# Patient Record
Sex: Female | Born: 1935 | Race: White | Hispanic: No | Smoking: Never smoker
Health system: Southern US, Community
[De-identification: ages and names within clinical notes are randomized; demographics above are authoritative.]

## PROBLEM LIST (undated history)

## (undated) DIAGNOSIS — I10 Essential (primary) hypertension: Secondary | ICD-10-CM

## (undated) DIAGNOSIS — I509 Heart failure, unspecified: Secondary | ICD-10-CM

## (undated) DIAGNOSIS — I340 Nonrheumatic mitral (valve) insufficiency: Secondary | ICD-10-CM

## (undated) DIAGNOSIS — J189 Pneumonia, unspecified organism: Secondary | ICD-10-CM

## (undated) DIAGNOSIS — E079 Disorder of thyroid, unspecified: Secondary | ICD-10-CM

## (undated) DIAGNOSIS — E785 Hyperlipidemia, unspecified: Secondary | ICD-10-CM

## (undated) DIAGNOSIS — J449 Chronic obstructive pulmonary disease, unspecified: Secondary | ICD-10-CM

## (undated) HISTORY — PX: CHOLECYSTECTOMY: SHX55

## (undated) HISTORY — PX: PACEMAKER INSERTION: SHX728

## (undated) HISTORY — PX: INSERTION OF ICD: SHX6689

## (undated) HISTORY — PX: CORONARY ARTERY BYPASS GRAFT: SHX141

---

## 2006-04-14 ENCOUNTER — Emergency Department (HOSPITAL_COMMUNITY): Admission: EM | Admit: 2006-04-14 | Discharge: 2006-04-15 | Payer: Self-pay | Admitting: Emergency Medicine
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
## 2015-02-05 ENCOUNTER — Non-Acute Institutional Stay: Payer: BC Managed Care – PPO | Admitting: Internal Medicine

## 2015-02-05 DIAGNOSIS — I255 Ischemic cardiomyopathy: Secondary | ICD-10-CM | POA: Diagnosis not present

## 2015-02-05 DIAGNOSIS — F418 Other specified anxiety disorders: Secondary | ICD-10-CM | POA: Diagnosis not present

## 2015-02-05 DIAGNOSIS — E0590 Thyrotoxicosis, unspecified without thyrotoxic crisis or storm: Secondary | ICD-10-CM

## 2015-02-05 DIAGNOSIS — E785 Hyperlipidemia, unspecified: Secondary | ICD-10-CM

## 2015-02-05 DIAGNOSIS — R5381 Other malaise: Secondary | ICD-10-CM | POA: Diagnosis not present

## 2015-02-05 DIAGNOSIS — K5900 Constipation, unspecified: Secondary | ICD-10-CM | POA: Insufficient documentation

## 2015-02-05 DIAGNOSIS — G4700 Insomnia, unspecified: Secondary | ICD-10-CM

## 2015-02-05 DIAGNOSIS — D509 Iron deficiency anemia, unspecified: Secondary | ICD-10-CM | POA: Diagnosis not present

## 2015-02-05 DIAGNOSIS — I498 Other specified cardiac arrhythmias: Secondary | ICD-10-CM | POA: Diagnosis not present

## 2015-02-05 DIAGNOSIS — I25700 Atherosclerosis of coronary artery bypass graft(s), unspecified, with unstable angina pectoris: Secondary | ICD-10-CM | POA: Diagnosis not present

## 2015-02-05 NOTE — Progress Notes (Signed)
Patient ID: Jane [name], female   DOB: [birth year], 81 y.o.   MRN: [id number]     Facility: [facility]   PCP: No primary care provider on file.  Code Status: full code  No Known Allergies  Chief Complaint  Patient presents with  . New Admit To SNF     HPI:  81 y.o. patient is here for short term rehabilitation post hospital admission from 01/01/15-01/30/15 with unstable angina. She was in another hospital in [place] prior to that with chest pain and dyspnea and was treated with Bipap and bumex gtt along with iv nitro. Cardiology was consulted. She underwent cardiac catheterization on 12/29/14 and found to have high grade ostial LAD and ostial circumflex stenosis and CABG was recommended. At this point, family wanter her to be transferred to [facility] for further care. She underwent CABG on 01/04/15. Post operatively, she had blood transfusion, she had arrythmia and required 4 rounds of DCCVs and then amiodarone loading. There was concern of thyroid storm and she was seen by endocrinology and is now on methimazole. She has PMH of CAD with multiple stents, ischemic cardiomyopathy, MR, biventricular ICD, HTN, COPD, GERD, HLD among others. She is seen in her room today with her daughter present. She feels her strength and appetite to be slowly returning. She has been anxious lately with her health. She had been on benzodiazepine in the hospital at a very low dose and had been very lethargic post this and would like to avoid it. She has been working with therapy team. Denies any concerns in particular.   Review of Systems:  Constitutional: Negative for fever, chills, diaphoresis.  HENT: Negative for headache, congestion, nasal discharge, sore throat, difficulty swallowing.   Eyes: Negative for eye pain, blurred vision, double vision and discharge.  Respiratory: has some dyspnea on exertion. Negative for cough, wheezing.   Cardiovascular: Negative for chest pain,  palpitations, leg swelling. denies chest wall soreness. Gastrointestinal: Negative for heartburn, nausea, vomiting, abdominal pain. Had bowel movement this am Genitourinary: Negative for dysuria, flank pain.  Musculoskeletal: Negative for back pain, falls Skin: Negative for itching, rash.  Neurological: Negative for tingling, focal weakness. Positive for dizziness at times. Psychiatric/Behavioral: Negative for depression.   PMH: CAD, ischemic cardiomyopathy, HTN, HLD, COPD, Hyperthyroidism  FAMILY HISTORY: non contributory  SOCIAL HISTORY: denies tobacco use, alcohol use or illicit drug use   Medications:   Medication List       This list is accurate as of: 02/05/15  3:20 PM.  Always use your most recent med list.               acetaminophen 325 MG tablet  Commonly known as:  TYLENOL  Take 650 mg by mouth every 4 (four) hours as needed.     amiodarone 200 MG tablet  Commonly known as:  PACERONE  Take 200 mg by mouth daily.     aspirin EC 81 MG tablet  Take 81 mg by mouth daily.     atorvastatin 20 MG tablet  Commonly known as:  LIPITOR  Take 20 mg by mouth daily.     carvedilol 3.125 MG tablet  Commonly known as:  COREG  Take 3.125 mg by mouth 2 (two) times daily with a meal.     cholecalciferol 1000 units tablet  Commonly known as:  VITAMIN D  Take 1,000 Units by mouth daily.     ferrous sulfate 325 (65 FE) MG tablet  Take 325 mg by  mouth daily with breakfast.     furosemide 20 MG tablet  Commonly known as:  LASIX  Take 20 mg by mouth daily.     lisinopril 2.5 MG tablet  Commonly known as:  PRINIVIL,ZESTRIL  Take 2.5 mg by mouth daily.     magnesium oxide 400 MG tablet  Commonly known as:  MAG-OX  Take 400 mg by mouth 2 (two) times daily.     Melatonin 3 MG Tabs  Take 1 tablet by mouth at bedtime.     methimazole 10 MG tablet  Commonly known as:  TAPAZOLE  Take 10 mg by mouth daily.     polyethylene glycol packet  Commonly known as:  MIRALAX /  GLYCOLAX  Take 17 g by mouth daily as needed.         Physical Exam: Filed Vitals:   02/05/15 1515  BP: 120/70  Pulse: 88  Temp: 98.9 F (37.2 C)  Resp: 18  Weight: 114 lb 9.6 oz (51.982 kg)  SpO2: 94%    General- elderly female, thin built, in no acute distress Head- normocephalic, atraumatic Nose- normal nasal mucosa, no maxillary or frontal sinus tenderness, no nasal discharge Throat- moist mucus membrane Eyes- PERRLA, EOMI, no pallor, no icterus, no discharge, normal conjunctiva, normal sclera Neck- no cervical lymphadenopathy, no jugular vein distension Cardiovascular- normal s1,s2, + murmur, palpable dorsalis pedis and radial pulses, trace leg edema Respiratory- bilateral clear to auscultation, no wheeze, no rhonchi, no crackles, no use of accessory muscles Abdomen- bowel sounds present, soft, non tender, no guarding or rigidity, no CVA tenderness Musculoskeletal- able to move all 4 extremities, generalized weakness with more of proximal weakness in LE.  Neurological- no focal deficit, alert and oriented to person, place and time Skin- warm and dry, sternal incision on the chest wall is healing well with steris trips in place, 3 small incision on chest wall with sutures, graft site on LLE healing well and catheterization site has healed Psychiatry- normal mood and affect   Labs reviewed: 01/29/15 na 137, k 4.1, bun 10, cr 0.5, glu 83, ca 8.7, wbc 5.3, hb 11.2, hct 35.6, plt 289    Assessment/Plan  Physical deconditioning Will have her work with physical therapy and occupational therapy team to help with gait training and muscle strengthening exercises.fall precautions. Skin care. Encourage to be out of bed.   CAD S/p CABG. Continue aspirin ec 81 mg daily, coreg 3.125 mg bid, lipitor 20 mg daily, lisinopril 2.5 mg daily. Has f/u with cardiology on 02/18/15. remains chest pain free. Not on any prn NTG. Add prn NTG   arrhythmia Currently her heart rate is controlled  and has sinus rhythm. Continue amiodarone 200 mg daily and monitor  Hyperthyroidism Continue methimazole 10 mg daily and monitor tsh  Anxiety Situational and experiences it at night time. With her being sensitive to benzodiazepine, try relaxation technique with lavender oil and warm apple juice at bedtime. Monitor clinically. If no improvement, consider anxiolytics  HLD  continue lipitor 20 mg daily  Ischemic cardiomyopathy Continue coreg, lisinopril and lasix 20 mg daily, check bmp  Iron def anemia S/p blood transfusion in hospital. Continue feso4 supplement and check cbc  Constipation Continue miralax daily prn and add senna s 1 tab qhs for now with her complaints of hard stool and straining  Insomnia Continue melatonin 3 mg qhs    Goals of care: short term rehabilitation   Labs/tests ordered: cbc with diff, cmp, tsh next lab  Family/ staff Communication:  reviewed care plan with patient and nursing supervisor    Blanchie Serve, MD  [facility] [phone] (Monday-Friday 8 am - 5 pm) [phone] (afterhours)

## 2015-02-13 ENCOUNTER — Non-Acute Institutional Stay: Payer: BC Managed Care – PPO | Admitting: Nurse Practitioner

## 2015-02-13 DIAGNOSIS — D509 Iron deficiency anemia, unspecified: Secondary | ICD-10-CM | POA: Diagnosis not present

## 2015-02-13 DIAGNOSIS — I25700 Atherosclerosis of coronary artery bypass graft(s), unspecified, with unstable angina pectoris: Secondary | ICD-10-CM | POA: Diagnosis not present

## 2015-02-13 DIAGNOSIS — I498 Other specified cardiac arrhythmias: Secondary | ICD-10-CM | POA: Diagnosis not present

## 2015-02-13 DIAGNOSIS — E785 Hyperlipidemia, unspecified: Secondary | ICD-10-CM

## 2015-02-13 DIAGNOSIS — E0590 Thyrotoxicosis, unspecified without thyrotoxic crisis or storm: Secondary | ICD-10-CM

## 2015-02-13 DIAGNOSIS — K5900 Constipation, unspecified: Secondary | ICD-10-CM

## 2015-02-13 DIAGNOSIS — F418 Other specified anxiety disorders: Secondary | ICD-10-CM | POA: Diagnosis not present

## 2015-02-13 NOTE — Progress Notes (Signed)
Patient ID: Jane [name], female   DOB: [birth year], 82 y.o.   MRN: [id number]     Facility: [facility]   PCP: No primary care provider on file.  Code Status: full code  No Known Allergies  Chief Complaint  Patient presents with  . Discharge Note     HPI:  82 y.o. patient being seen today for discharge home. She has PMH of CAD with multiple stents, ischemic cardiomyopathy, MR, biventricular ICD, HTN, COPD, GERD, HLD . [facility] here for short term rehabilitation After hospitalization for with unstable angina.  She underwent CABG on 01/04/15. Post operatively, she had blood transfusion, she had arrythmia and required 4 rounds of DCCVs and then amiodarone loading. Pt conts on PO amiodarone at this time. Endocrine was following and concern of thyroid storm, currently on methimazole. She had been anxious lately with her health but reports this is improving. Denies chest pains or shortness of breath. Incisions healing well. Patient currently doing well with therapy, now stable to discharge home with home health.   Review of Systems:  Review of Systems  Constitutional: Negative for fever, chills and weight loss.  Respiratory: Negative for cough, sputum production and shortness of breath.   Cardiovascular: Negative for chest pain, palpitations and leg swelling.  Gastrointestinal: Negative for heartburn, abdominal pain, diarrhea and constipation.  Genitourinary: Negative for dysuria, urgency and frequency.  Musculoskeletal: Negative for myalgias and back pain.       Chest tenderness with movement, pain controlled  Skin: Negative.   Neurological: Negative for dizziness and headaches.  Psychiatric/Behavioral: Negative for depression and memory loss. The patient is nervous/anxious (has improved). The patient does not have insomnia.      PMH: CAD, ischemic cardiomyopathy, HTN, HLD, COPD, Hyperthyroidism  FAMILY HISTORY: non contributory  SOCIAL  HISTORY: denies tobacco use, alcohol use or illicit drug use   Medications:   Medication List       This list is accurate as of: 02/13/15 11:42 AM.  Always use your most recent med list.               acetaminophen 325 MG tablet  Commonly known as:  TYLENOL  Take 650 mg by mouth every 4 (four) hours as needed.     amiodarone 200 MG tablet  Commonly known as:  PACERONE  Take 200 mg by mouth daily.     aspirin EC 81 MG tablet  Take 81 mg by mouth daily.     atorvastatin 20 MG tablet  Commonly known as:  LIPITOR  Take 20 mg by mouth daily.     carvedilol 3.125 MG tablet  Commonly known as:  COREG  Take 3.125 mg by mouth 2 (two) times daily with a meal.     cholecalciferol 1000 units tablet  Commonly known as:  VITAMIN D  Take 1,000 Units by mouth daily.     ferrous sulfate 325 (65 FE) MG tablet  Take 325 mg by mouth daily with breakfast.     furosemide 20 MG tablet  Commonly known as:  LASIX  Take 20 mg by mouth daily.     lisinopril 2.5 MG tablet  Commonly known as:  PRINIVIL,ZESTRIL  Take 2.5 mg by mouth daily.     magnesium oxide 400 MG tablet  Commonly known as:  MAG-OX  Take 400 mg by mouth 2 (two) times daily.     Melatonin 3 MG Tabs  Take 1 tablet by mouth at bedtime.  methimazole 10 MG tablet  Commonly known as:  TAPAZOLE  Take 10 mg by mouth daily.     polyethylene glycol packet  Commonly known as:  MIRALAX / GLYCOLAX  Take 17 g by mouth daily as needed.     potassium chloride 10 MEQ tablet  Commonly known as:  K-DUR,KLOR-CON  Take 10 mEq by mouth daily.     sennosides-docusate sodium 8.6-50 MG tablet  Commonly known as:  SENOKOT-S  Take 1 tablet by mouth daily.         Physical Exam: Filed Vitals:   02/13/15 1124  BP: 123/66  Pulse: 90  Temp: 97.8 F (36.6 C)  Resp: 18  Weight: 107 lb (48.535 kg)   Physical Exam  Constitutional: She is oriented to person, place, and time. She appears well-developed and well-nourished.    Eyes: Conjunctivae are normal. Pupils are equal, round, and reactive to light.  Neck: Normal range of motion. Neck supple.  Cardiovascular: Normal rate, regular rhythm and normal heart sounds.   Respiratory: Effort normal and breath sounds normal.  GI: Soft. Bowel sounds are normal. She exhibits no distension.  Musculoskeletal: She exhibits no edema or tenderness.  Neurological: She is alert and oriented to person, place, and time.  Skin: Skin is warm and dry.  Well healing chest incision and graft site to LLE     Labs reviewed: 01/29/15 na 137, k 4.1, bun 10, cr 0.5, glu 83, ca 8.7, wbc 5.3, hb 11.2, hct 35.6, plt 289 02/05/15 na 142, k 4.6, BUN 8, Cr 0.67, glucose 68,  TSH 28 Wbc 5.8, rbc 3.8, hgb 11.4, hct 36.8, plt 256    Assessment/Plan 1. Coronary artery disease involving coronary bypass graft of native heart with unstable angina pectoris (HCC) s/p CABG. Doing well post op at [facility], Continue aspirin 81 mg daily, coreg 3.125 mg BID , lipitor 20 mg daily, lisinopril 2.5 mg daily. remains chest pain free.  Reports she has 2 appts next week to follow up.   2. Constipation, unspecified constipation type Controlled on current regimen, to cont miralax with senna s   3. Hyperthyroidism Started on methimazole 10 mg daily during hospitalization by endocrine, Elevated TSH on recent labs, recent thyroid storm, will need outpatient follow up of TSH by endocrine or PCP  4. Situational anxiety Due to health, reports this has improved at this time.  5. Hyperlipidemia To cont on lipitor 20 mg daily   6. Anemia, iron deficiency Stable, conts on iron daily   7. Other cardiac arrhythmia Stable, conts on amiodarone. Follow up with cardiology scheduled  pt is stable for discharge-will need PT/OT/per home health. No DME needed. Rx written.  will need to follow up with PCP within 1-2 weeks.    Janene Harvey. Biagio Borg  [facility] [phone] 8 am - 5  pm) [phone] (after hours)

## 2015-02-14 DIAGNOSIS — I482 Chronic atrial fibrillation: Secondary | ICD-10-CM | POA: Diagnosis not present

## 2015-02-14 DIAGNOSIS — I509 Heart failure, unspecified: Secondary | ICD-10-CM | POA: Diagnosis not present

## 2015-02-14 DIAGNOSIS — I255 Ischemic cardiomyopathy: Secondary | ICD-10-CM | POA: Diagnosis not present

## 2015-02-14 DIAGNOSIS — I110 Hypertensive heart disease with heart failure: Secondary | ICD-10-CM | POA: Diagnosis not present

## 2015-02-14 DIAGNOSIS — Z48812 Encounter for surgical aftercare following surgery on the circulatory system: Secondary | ICD-10-CM | POA: Diagnosis not present

## 2015-02-14 DIAGNOSIS — I25110 Atherosclerotic heart disease of native coronary artery with unstable angina pectoris: Secondary | ICD-10-CM | POA: Diagnosis not present

## 2015-03-24 ENCOUNTER — Other Ambulatory Visit: Payer: Self-pay | Admitting: Internal Medicine

## 2015-03-27 ENCOUNTER — Other Ambulatory Visit: Payer: Self-pay | Admitting: Internal Medicine

## 2015-04-07 ENCOUNTER — Other Ambulatory Visit: Payer: Self-pay | Admitting: Internal Medicine

## 2015-04-08 ENCOUNTER — Other Ambulatory Visit: Payer: Self-pay | Admitting: Internal Medicine

## 2015-06-10 ENCOUNTER — Other Ambulatory Visit: Payer: Self-pay | Admitting: Internal Medicine

## 2020-12-31 ENCOUNTER — Other Ambulatory Visit: Payer: Self-pay

## 2020-12-31 ENCOUNTER — Encounter (HOSPITAL_BASED_OUTPATIENT_CLINIC_OR_DEPARTMENT_OTHER): Payer: Self-pay | Admitting: *Deleted

## 2020-12-31 ENCOUNTER — Emergency Department (HOSPITAL_BASED_OUTPATIENT_CLINIC_OR_DEPARTMENT_OTHER)
Admission: EM | Admit: 2020-12-31 | Discharge: 2021-01-01 | Disposition: A | Discharge status: Home / Self Care | Payer: Medicare Other | Attending: Emergency Medicine | Admitting: Emergency Medicine

## 2020-12-31 DIAGNOSIS — I509 Heart failure, unspecified: Secondary | ICD-10-CM | POA: Insufficient documentation

## 2020-12-31 DIAGNOSIS — Z951 Presence of aortocoronary bypass graft: Secondary | ICD-10-CM | POA: Diagnosis not present

## 2020-12-31 DIAGNOSIS — Z79899 Other long term (current) drug therapy: Secondary | ICD-10-CM | POA: Diagnosis not present

## 2020-12-31 DIAGNOSIS — I110 Hypertensive heart disease with heart failure: Secondary | ICD-10-CM | POA: Insufficient documentation

## 2020-12-31 DIAGNOSIS — R040 Epistaxis: Secondary | ICD-10-CM | POA: Diagnosis not present

## 2020-12-31 DIAGNOSIS — Z7982 Long term (current) use of aspirin: Secondary | ICD-10-CM | POA: Insufficient documentation

## 2020-12-31 HISTORY — DX: Disorder of thyroid, unspecified: E07.9

## 2020-12-31 HISTORY — DX: Pneumonia, unspecified organism: J18.9

## 2020-12-31 HISTORY — DX: Hyperlipidemia, unspecified: E78.5

## 2020-12-31 HISTORY — DX: Heart failure, unspecified: I50.9

## 2020-12-31 HISTORY — DX: Nonrheumatic mitral (valve) insufficiency: I34.0

## 2020-12-31 HISTORY — DX: Essential (primary) hypertension: I10

## 2020-12-31 NOTE — ED Triage Notes (Signed)
C/o nosebleed left nare x 6  hrs denies injury, bleeding controlled

## 2021-01-01 DIAGNOSIS — R040 Epistaxis: Secondary | ICD-10-CM | POA: Diagnosis not present

## 2021-01-01 MED ORDER — SILVER NITRATE-POT NITRATE 75-25 % EX MISC
CUTANEOUS | Status: AC
  Administered 2021-01-01: 1 via TOPICAL
  Filled 2021-01-01: qty 10

## 2021-01-01 MED ORDER — OXYMETAZOLINE HCL 0.05 % NA SOLN
3.0000 | Freq: Two times a day (BID) | NASAL | Status: DC | PRN
  Administered 2021-01-01: 3 via NASAL
  Filled 2021-01-01: qty 30

## 2021-01-01 MED ORDER — SILVER NITRATE-POT NITRATE 75-25 % EX MISC: 4.0000 | Freq: Once | CUTANEOUS | Status: AC

## 2021-01-01 NOTE — ED Provider Notes (Signed)
[facility] Provider Note: [name], MD, FACEP  CSN: [id number] MRN: [id number] ARRIVAL: 12/31/20 at 2245 ROOM: MH07/MH07   CHIEF COMPLAINT  Epistaxis   HISTORY OF PRESENT ILLNESS  01/01/21 12:26 AM Jane [name] is a 87 y.o. female with left-sided epistaxis beginning about 6 hours prior to arrival.  The bleeding has been intermittent and mild to moderate but never heavy.  It is bleeding from the anterior naris.  She denies trauma.  She is not on anticoagulation.  She has been able to get it to slow with pressure but never completely abate.   Past Medical History:  Diagnosis Date   CHF (congestive heart failure) (HCC)    Hyperlipidemia    Hypertension    Mitral regurgitation    Pneumonia    Thyroid disease     Past Surgical History:  Procedure Laterality Date   CHOLECYSTECTOMY     CORONARY ARTERY BYPASS GRAFT     PACEMAKER INSERTION      No family history on file.  Social History   Tobacco Use   Smoking status: Never   Smokeless tobacco: Never  Substance Use Topics   Alcohol use: Not Currently   Drug use: Not Currently    Prior to Admission medications   Medication Sig Start Date End Date Taking? Authorizing Provider  acetaminophen (TYLENOL) 325 MG tablet Take 650 mg by mouth every 4 (four) hours as needed.    [provider]  amiodarone (PACERONE) 200 MG tablet Take 200 mg by mouth daily.    [provider]  aspirin EC 81 MG tablet Take 81 mg by mouth daily.    [provider]  atorvastatin (LIPITOR) 20 MG tablet Take 20 mg by mouth daily.    [provider]  carvedilol (COREG) 3.125 MG tablet Take 3.125 mg by mouth 2 (two) times daily with a meal.    [provider]  cholecalciferol (VITAMIN D) 1000 units tablet Take 1,000 Units by mouth daily.    [provider]  ferrous sulfate 325 (65 FE) MG tablet Take 325 mg by mouth daily with breakfast.    [provider]  furosemide  (LASIX) 20 MG tablet Take 20 mg by mouth daily.    [provider]  lisinopril (PRINIVIL,ZESTRIL) 2.5 MG tablet Take 2.5 mg by mouth daily.    [provider]  magnesium oxide (MAG-OX) 400 MG tablet Take 400 mg by mouth 2 (two) times daily.    [provider]  Melatonin 3 MG TABS Take 1 tablet by mouth at bedtime.    [provider]  methimazole (TAPAZOLE) 10 MG tablet Take 10 mg by mouth daily.    [provider]  polyethylene glycol (MIRALAX / GLYCOLAX) packet Take 17 g by mouth daily as needed.    [provider]  potassium chloride (K-DUR,KLOR-CON) 10 MEQ tablet Take 10 mEq by mouth daily.    [provider]  sennosides-docusate sodium (SENOKOT-S) 8.6-50 MG tablet Take 1 tablet by mouth daily.    [provider]    Allergies Patient has no known allergies.   REVIEW OF SYSTEMS  Negative except as noted here or in the History of Present Illness.   PHYSICAL EXAMINATION  Initial Vital Signs Blood pressure 123/63, pulse 74, temperature (!) 97.5 F (36.4 C), temperature source Tympanic, resp. rate 18, height 5\' 1"  (1.549 m), weight 44 kg, SpO2 97 %.  Examination General: Well-developed, well-nourished female in no acute distress; appearance consistent with  age of record HENT: normocephalic; atraumatic; blood oozing from left anterior septum Eyes: Normal appearing Neck: supple Heart: regular rate and rhythm; harsh systolic murmur Lungs: clear to auscultation bilaterally Abdomen: soft; nondistended; nontender; bowel sounds present Extremities: No deformity; full range of motion; pulses normal Neurologic: Awake, alert and oriented; motor function intact in all extremities and symmetric; no facial droop Skin: Warm and dry Psychiatric: Normal mood and affect   RESULTS  Summary of this visit's results, reviewed and interpreted by myself:   EKG Interpretation  Date/Time:    Ventricular Rate:    PR Interval:     QRS Duration:   QT Interval:    QTC Calculation:   R Axis:     Text Interpretation:         Laboratory Studies: No results found for this or any previous visit (from the past 24 hour(s)). Imaging Studies: No results found.  ED COURSE and MDM  Nursing notes, initial and subsequent vitals signs, including pulse oximetry, reviewed and interpreted by myself.  Vitals:   12/31/20 2254 12/31/20 2259 12/31/20 2303 01/01/21 0021  BP:  123/63  114/77  Pulse:  74  77  Resp:  18  18  Temp:   (!) 97.5 F (36.4 C)   TempSrc:  Oral Tympanic   SpO2:  97%  99%  Weight: 44 kg     Height: 5\' 1"  (1.549 m)      Medications  oxymetazoline (AFRIN) 0.05 % nasal spray 3 spray (has no administration in time range)  silver nitrate applicators applicator 4 Stick (1 Stick Topical Given 01/01/21 0034)    1:05 AM Left naris still hemostatic.  Patient will be sent home with Afrin as needed for recurrence.  PROCEDURES  .Epistaxis Management  Date/Time: 01/01/2021 12:42 AM Performed by: Nanea Jared, MD Authorized by: Trevontae Lindahl, MD   Consent:    Consent obtained:  Verbal   Consent given by:  Patient   Risks, benefits, and alternatives were discussed: yes     Risks discussed:  Pain Universal protocol:    Procedure explained and questions answered to patient or proxy's satisfaction: yes     Patient identity confirmed:  Verbally with patient Anesthesia:    Anesthesia method:  None Procedure details:    Treatment site:  L anterior   Treatment method:  Silver nitrate   Treatment complexity:  Limited   Treatment episode: initial   Post-procedure details:    Assessment:  Bleeding stopped   Procedure completion:  Tolerated well, no immediate complications   ED DIAGNOSES     ICD-10-CM   1. Acute anterior epistaxis  R04.0          Galya Dunnigan, 01/03/2021, MD 01/01/21 01/03/21

## 2022-04-26 ENCOUNTER — Inpatient Hospital Stay (HOSPITAL_BASED_OUTPATIENT_CLINIC_OR_DEPARTMENT_OTHER)
Admission: EM | Admit: 2022-04-26 | Discharge: 2022-05-02 | DRG: 291 | Disposition: A | Discharge status: Hospice | Payer: Medicare Other | Attending: Internal Medicine | Admitting: Internal Medicine

## 2022-04-26 ENCOUNTER — Emergency Department (HOSPITAL_BASED_OUTPATIENT_CLINIC_OR_DEPARTMENT_OTHER): Payer: Medicare Other

## 2022-04-26 DIAGNOSIS — K314 Gastric diverticulum: Secondary | ICD-10-CM | POA: Diagnosis present

## 2022-04-26 DIAGNOSIS — K259 Gastric ulcer, unspecified as acute or chronic, without hemorrhage or perforation: Secondary | ICD-10-CM | POA: Diagnosis present

## 2022-04-26 DIAGNOSIS — I5082 Biventricular heart failure: Secondary | ICD-10-CM | POA: Diagnosis present

## 2022-04-26 DIAGNOSIS — E43 Unspecified severe protein-calorie malnutrition: Secondary | ICD-10-CM | POA: Diagnosis present

## 2022-04-26 DIAGNOSIS — N281 Cyst of kidney, acquired: Secondary | ICD-10-CM | POA: Diagnosis present

## 2022-04-26 DIAGNOSIS — J449 Chronic obstructive pulmonary disease, unspecified: Secondary | ICD-10-CM | POA: Diagnosis present

## 2022-04-26 DIAGNOSIS — K561 Intussusception: Secondary | ICD-10-CM

## 2022-04-26 DIAGNOSIS — J9610 Chronic respiratory failure, unspecified whether with hypoxia or hypercapnia: Secondary | ICD-10-CM | POA: Diagnosis present

## 2022-04-26 DIAGNOSIS — Z9981 Dependence on supplemental oxygen: Secondary | ICD-10-CM

## 2022-04-26 DIAGNOSIS — Z79899 Other long term (current) drug therapy: Secondary | ICD-10-CM

## 2022-04-26 DIAGNOSIS — Z951 Presence of aortocoronary bypass graft: Secondary | ICD-10-CM

## 2022-04-26 DIAGNOSIS — I083 Combined rheumatic disorders of mitral, aortic and tricuspid valves: Secondary | ICD-10-CM | POA: Diagnosis present

## 2022-04-26 DIAGNOSIS — I5043 Acute on chronic combined systolic (congestive) and diastolic (congestive) heart failure: Secondary | ICD-10-CM | POA: Diagnosis present

## 2022-04-26 DIAGNOSIS — I959 Hypotension, unspecified: Secondary | ICD-10-CM | POA: Diagnosis present

## 2022-04-26 DIAGNOSIS — R8271 Bacteriuria: Secondary | ICD-10-CM | POA: Diagnosis present

## 2022-04-26 DIAGNOSIS — E0590 Thyrotoxicosis, unspecified without thyrotoxic crisis or storm: Secondary | ICD-10-CM | POA: Diagnosis present

## 2022-04-26 DIAGNOSIS — Z7982 Long term (current) use of aspirin: Secondary | ICD-10-CM

## 2022-04-26 DIAGNOSIS — E876 Hypokalemia: Secondary | ICD-10-CM | POA: Diagnosis present

## 2022-04-26 DIAGNOSIS — Z7189 Other specified counseling: Secondary | ICD-10-CM

## 2022-04-26 DIAGNOSIS — E785 Hyperlipidemia, unspecified: Secondary | ICD-10-CM | POA: Diagnosis present

## 2022-04-26 DIAGNOSIS — K449 Diaphragmatic hernia without obstruction or gangrene: Secondary | ICD-10-CM | POA: Diagnosis present

## 2022-04-26 DIAGNOSIS — Z955 Presence of coronary angioplasty implant and graft: Secondary | ICD-10-CM

## 2022-04-26 DIAGNOSIS — Z95810 Presence of automatic (implantable) cardiac defibrillator: Secondary | ICD-10-CM

## 2022-04-26 DIAGNOSIS — I130 Hypertensive heart and chronic kidney disease with heart failure and stage 1 through stage 4 chronic kidney disease, or unspecified chronic kidney disease: Principal | ICD-10-CM | POA: Diagnosis present

## 2022-04-26 DIAGNOSIS — R0602 Shortness of breath: Secondary | ICD-10-CM

## 2022-04-26 DIAGNOSIS — Z1152 Encounter for screening for COVID-19: Secondary | ICD-10-CM

## 2022-04-26 DIAGNOSIS — Z515 Encounter for palliative care: Secondary | ICD-10-CM

## 2022-04-26 DIAGNOSIS — Z66 Do not resuscitate: Secondary | ICD-10-CM | POA: Diagnosis not present

## 2022-04-26 DIAGNOSIS — I482 Chronic atrial fibrillation, unspecified: Secondary | ICD-10-CM | POA: Diagnosis present

## 2022-04-26 DIAGNOSIS — K219 Gastro-esophageal reflux disease without esophagitis: Secondary | ICD-10-CM | POA: Diagnosis present

## 2022-04-26 DIAGNOSIS — Z681 Body mass index (BMI) 19 or less, adult: Secondary | ICD-10-CM

## 2022-04-26 DIAGNOSIS — Z7901 Long term (current) use of anticoagulants: Secondary | ICD-10-CM

## 2022-04-26 DIAGNOSIS — I5023 Acute on chronic systolic (congestive) heart failure: Secondary | ICD-10-CM | POA: Diagnosis present

## 2022-04-26 DIAGNOSIS — N1832 Chronic kidney disease, stage 3b: Secondary | ICD-10-CM | POA: Diagnosis present

## 2022-04-26 DIAGNOSIS — E039 Hypothyroidism, unspecified: Secondary | ICD-10-CM | POA: Diagnosis present

## 2022-04-26 DIAGNOSIS — Z7984 Long term (current) use of oral hypoglycemic drugs: Secondary | ICD-10-CM

## 2022-04-26 DIAGNOSIS — I252 Old myocardial infarction: Secondary | ICD-10-CM

## 2022-04-26 DIAGNOSIS — I255 Ischemic cardiomyopathy: Secondary | ICD-10-CM | POA: Diagnosis present

## 2022-04-26 DIAGNOSIS — I447 Left bundle-branch block, unspecified: Secondary | ICD-10-CM | POA: Diagnosis present

## 2022-04-26 DIAGNOSIS — R7989 Other specified abnormal findings of blood chemistry: Secondary | ICD-10-CM

## 2022-04-26 DIAGNOSIS — I480 Paroxysmal atrial fibrillation: Secondary | ICD-10-CM | POA: Diagnosis present

## 2022-04-26 DIAGNOSIS — R109 Unspecified abdominal pain: Secondary | ICD-10-CM

## 2022-04-26 DIAGNOSIS — D509 Iron deficiency anemia, unspecified: Secondary | ICD-10-CM | POA: Diagnosis present

## 2022-04-26 DIAGNOSIS — I2510 Atherosclerotic heart disease of native coronary artery without angina pectoris: Secondary | ICD-10-CM | POA: Diagnosis present

## 2022-04-26 HISTORY — DX: Chronic obstructive pulmonary disease, unspecified: J44.9

## 2022-04-26 LAB — COMPREHENSIVE METABOLIC PANEL
ALT: 15 U/L (ref 0–44)
AST: 16 U/L (ref 15–41)
Albumin: 3.4 g/dL — ABNORMAL LOW (ref 3.5–5.0)
Alkaline Phosphatase: 65 U/L (ref 38–126)
Anion gap: 10 (ref 5–15)
BUN: 36 mg/dL — ABNORMAL HIGH (ref 8–23)
CO2: 23 mmol/L (ref 22–32)
Calcium: 9.1 mg/dL (ref 8.9–10.3)
Chloride: 108 mmol/L (ref 98–111)
Creatinine, Ser: 1.47 mg/dL — ABNORMAL HIGH (ref 0.44–1.00)
GFR, Estimated: 34 mL/min — ABNORMAL LOW (ref >60)
Glucose, Bld: 118 mg/dL — ABNORMAL HIGH (ref 70–99)
Potassium: 3.2 mmol/L — ABNORMAL LOW (ref 3.5–5.1)
Sodium: 141 mmol/L (ref 135–145)
Total Bilirubin: 1.1 mg/dL (ref 0.3–1.2)
Total Protein: 7.1 g/dL (ref 6.5–8.1)

## 2022-04-26 LAB — RESP PANEL BY RT-PCR (RSV, FLU A&B, COVID)  RVPGX2
Influenza A by PCR: NEGATIVE
Influenza B by PCR: NEGATIVE
Resp Syncytial Virus by PCR: NEGATIVE
SARS Coronavirus 2 by RT PCR: NEGATIVE

## 2022-04-26 LAB — LIPASE, BLOOD: Lipase: 36 U/L (ref 11–51)

## 2022-04-26 LAB — CBC
HCT: 44.4 % (ref 36.0–46.0)
Hemoglobin: 13.7 g/dL (ref 12.0–15.0)
MCH: 32.3 pg (ref 26.0–34.0)
MCHC: 30.9 g/dL (ref 30.0–36.0)
MCV: 104.7 fL — ABNORMAL HIGH (ref 80.0–100.0)
Platelets: 185 10*3/uL (ref 150–400)
RBC: 4.24 MIL/uL (ref 3.87–5.11)
RDW: 16.1 % — ABNORMAL HIGH (ref 11.5–15.5)
WBC: 10 10*3/uL (ref 4.0–10.5)
nRBC: 0.4 % — ABNORMAL HIGH (ref 0.0–0.2)

## 2022-04-26 LAB — LACTIC ACID, PLASMA: Lactic Acid, Venous: 1 mmol/L (ref 0.5–1.9)

## 2022-04-26 LAB — BRAIN NATRIURETIC PEPTIDE: B Natriuretic Peptide: 1839.9 pg/mL — ABNORMAL HIGH (ref 0.0–100.0)

## 2022-04-26 LAB — TROPONIN I (HIGH SENSITIVITY): Troponin I (High Sensitivity): 38 ng/L — ABNORMAL HIGH (ref <18)

## 2022-04-26 MED ORDER — IOHEXOL 300 MG/ML  SOLN
60.0000 mL | Freq: Once | INTRAMUSCULAR | Status: AC | PRN
  Administered 2022-04-26: 60 mL via INTRAVENOUS

## 2022-04-26 NOTE — ED Provider Notes (Signed)
[facility] Provider Note   CSN: [id number] Arrival date & time: 04/26/22  2035     History {Add pertinent medical, surgical, social history, OB history to HPI:1} Chief Complaint  Patient presents with   Abdominal Pain    Jane [name] is a 89 y.o. female.   Abdominal Pain      Home Medications Prior to Admission medications   Medication Sig Start Date End Date Taking? Authorizing Provider  acetaminophen (TYLENOL) 325 MG tablet Take 650 mg by mouth every 4 (four) hours as needed.    [provider]  amiodarone (PACERONE) 200 MG tablet Take 200 mg by mouth daily.    [provider]  aspirin EC 81 MG tablet Take 81 mg by mouth daily.    [provider]  atorvastatin (LIPITOR) 20 MG tablet Take 20 mg by mouth daily.    [provider]  carvedilol (COREG) 3.125 MG tablet Take 3.125 mg by mouth 2 (two) times daily with a meal.    [provider]  cholecalciferol (VITAMIN D) 1000 units tablet Take 1,000 Units by mouth daily.    [provider]  ferrous sulfate 325 (65 FE) MG tablet Take 325 mg by mouth daily with breakfast.    [provider]  furosemide (LASIX) 20 MG tablet Take 20 mg by mouth daily.    [provider]  lisinopril (PRINIVIL,ZESTRIL) 2.5 MG tablet Take 2.5 mg by mouth daily.    [provider]  magnesium oxide (MAG-OX) 400 MG tablet Take 400 mg by mouth 2 (two) times daily.    [provider]  Melatonin 3 MG TABS Take 1 tablet by mouth at bedtime.    [provider]  methimazole (TAPAZOLE) 10 MG tablet Take 10 mg by mouth daily.    [provider]  polyethylene glycol (MIRALAX / GLYCOLAX) packet Take 17 g by mouth daily as needed.    [provider]  potassium chloride (K-DUR,KLOR-CON) 10 MEQ tablet Take 10 mEq by mouth daily.    [provider]  sennosides-docusate sodium (SENOKOT-S) 8.6-50 MG  tablet Take 1 tablet by mouth daily.    [provider]      Allergies    Patient has no known allergies.    Review of Systems   Review of Systems  Gastrointestinal:  Positive for abdominal pain.    Physical Exam Updated Vital Signs BP 136/73   Pulse 76   Temp 97.6 F (36.4 C) (Oral)   Resp (!) 22   SpO2 100%  Physical Exam  ED Results / Procedures / Treatments   Labs (all labs ordered are listed, but only abnormal results are displayed) Labs Reviewed  COMPREHENSIVE METABOLIC PANEL - Abnormal; Notable for the following components:      Result Value   Potassium 3.2 (*)    Glucose, Bld 118 (*)    BUN 36 (*)    Creatinine, Ser 1.47 (*)    Albumin 3.4 (*)    GFR, Estimated 34 (*)    All other components within normal limits  CBC - Abnormal; Notable for the following components:   MCV 104.7 (*)    RDW 16.1 (*)    nRBC 0.4 (*)    All other components within normal limits  LIPASE, BLOOD  URINALYSIS, ROUTINE W REFLEX MICROSCOPIC    EKG None  Radiology No results found.  Procedures Procedures  {Document cardiac monitor, telemetry assessment procedure when appropriate:1}  Medications  Ordered in ED Medications - No data to display  ED Course/ Medical Decision Making/ A&P   {   Click here for ABCD2, HEART and other calculatorsREFRESH Note before signing :1}                          Medical Decision Making Amount and/or Complexity of Data Reviewed Labs: ordered. Radiology: ordered.  Risk Prescription drug management.    Jane [name] is a 89 y.o. female with a complex past medical history including hypertension, hyperlipidemia, CHF, thyroid disease, previous cholecystectomy, CAD with previous CABG, and pacemaker who presents with decreased oral intake, belching, worsening peripheral edema, shortness of breath and difficulty breathing, and severe abdominal pain.  According to patient, for the last 2 days she has been having on and off severe abdominal  pain is up to 10 out of 10 in severity.  She is not having the pain right now.  She has had some nausea and vomiting but is not vomiting now.  She reports no constipation or diarrhea and denies any trauma.  She reports she has had difficulty breathing for the last week or 2 and has been using home ox intermittently.  She has had more edema in her legs than normal.  She denies any new leg pain.  Denies any significant cough or congestion.  Denies any sick contacts.  Denies any dysuria or hematuria or urinary symptoms although she just was treated for UTI several weeks ago.  On my exam, abdomen was nontender and you could hear a loud murmur all over her torso.  Back and flanks nontender.  Intact pulses in extremities.  Some edema seen in the feet.  Patient well-appearing and answering questions appropriately.  She is very thin.  Dry mucous membranes.  Due to the patient's severe abdominal pain we will get CT imaging.  Will get chest x-ray and labs.  With her edema and short of breath we will get a BNP and troponin.    Patient CT just returned showing evidence of duodenal intussusception.  With the patient is 10 out of 10 pain that comes and goes this could fit especially with the belching and nausea and vomiting.  It also shows the gastric antrum with ulceration.  No perforation described.  BNP is elevated at 1800 and troponin is elevated at 38, will trend.  Lactic acid not elevated and viral testing negative.  She does not have a leukocytosis or significant anemia.  Lipase normal.  Creatinine elevated 1.4 but otherwise LFTs normal.  Still waiting on urinalysis.  With this intussusception, will consult general surgery however I anticipate this would not be something that need surgery tonight as she is now symptom-free.  With her likely fluid overload and shortness of breath I do feel she needs admission.  Patient may need to talk to GI as well during admission to  get endoscopy as recommended by radiology on  their read.  They are also saying she has atelectasis or infiltrate although with her lack of cough have less suspicion for pneumonia at this time.  Anticipate admission after discussion with general surgery to make sure she does not need emergent surgery.   12:01 AM Spoke to Dr. Brantley Stage with general surgery who suspect this looks more like malignancy than intussusception at this time.  He does feel that the patient would benefit from admission and likely seen by GI tomorrow.  He agrees that his team will see them and says  she could go to either hospital.  He agrees with admission for the diuresis for the shortness of breath and edema.  I spoke with family and they agree with plan of care.  Will call for medicine admission for intermittent abdominal pain with imaging showing evidence of possible intussusception or ulceration as well as fluid overload with elevated BNP and troponin.  Patient will be admitted.   {Document critical care time when appropriate:1} {Document review of labs and clinical decision tools ie heart score, Chads2Vasc2 etc:1}  {Document your independent review of radiology images, and any outside records:1} {Document your discussion with family members, caretakers, and with consultants:1} {Document social determinants of health affecting pt's care:1} {Document your decision making why or why not admission, treatments were needed:1} Final Clinical Impression(s) / ED Diagnoses Final diagnoses:  None    Rx / DC Orders ED Discharge Orders     None

## 2022-04-26 NOTE — ED Notes (Signed)
Patient transported to CT 

## 2022-04-26 NOTE — ED Triage Notes (Signed)
Pt here with daughters for RLQ pain that radiates into R leg, increased belching, and bilateral lower leg swelling. Pt has hx of CHF, pacemaker, 7 stents, COPD. Pt has oxygen at home that she's supposed to wear at night but pt refuses. Per daughters, pt told them she took 5 nitroglycerin's yesterday to help with her abdominal pain.

## 2022-04-27 ENCOUNTER — Encounter (HOSPITAL_BASED_OUTPATIENT_CLINIC_OR_DEPARTMENT_OTHER): Payer: Self-pay | Admitting: Family Medicine

## 2022-04-27 ENCOUNTER — Other Ambulatory Visit: Payer: Self-pay

## 2022-04-27 DIAGNOSIS — R7989 Other specified abnormal findings of blood chemistry: Secondary | ICD-10-CM | POA: Diagnosis not present

## 2022-04-27 DIAGNOSIS — I5082 Biventricular heart failure: Secondary | ICD-10-CM | POA: Diagnosis present

## 2022-04-27 DIAGNOSIS — E0590 Thyrotoxicosis, unspecified without thyrotoxic crisis or storm: Secondary | ICD-10-CM | POA: Diagnosis present

## 2022-04-27 DIAGNOSIS — R142 Eructation: Secondary | ICD-10-CM

## 2022-04-27 DIAGNOSIS — R110 Nausea: Secondary | ICD-10-CM

## 2022-04-27 DIAGNOSIS — Z7189 Other specified counseling: Secondary | ICD-10-CM | POA: Insufficient documentation

## 2022-04-27 DIAGNOSIS — E039 Hypothyroidism, unspecified: Secondary | ICD-10-CM | POA: Diagnosis present

## 2022-04-27 DIAGNOSIS — I340 Nonrheumatic mitral (valve) insufficiency: Secondary | ICD-10-CM | POA: Diagnosis not present

## 2022-04-27 DIAGNOSIS — R0602 Shortness of breath: Secondary | ICD-10-CM | POA: Diagnosis not present

## 2022-04-27 DIAGNOSIS — K259 Gastric ulcer, unspecified as acute or chronic, without hemorrhage or perforation: Secondary | ICD-10-CM | POA: Diagnosis present

## 2022-04-27 DIAGNOSIS — N1832 Chronic kidney disease, stage 3b: Secondary | ICD-10-CM | POA: Diagnosis present

## 2022-04-27 DIAGNOSIS — D509 Iron deficiency anemia, unspecified: Secondary | ICD-10-CM | POA: Diagnosis present

## 2022-04-27 DIAGNOSIS — I083 Combined rheumatic disorders of mitral, aortic and tricuspid valves: Secondary | ICD-10-CM | POA: Diagnosis present

## 2022-04-27 DIAGNOSIS — Z7901 Long term (current) use of anticoagulants: Secondary | ICD-10-CM | POA: Diagnosis not present

## 2022-04-27 DIAGNOSIS — I5042 Chronic combined systolic (congestive) and diastolic (congestive) heart failure: Secondary | ICD-10-CM | POA: Diagnosis not present

## 2022-04-27 DIAGNOSIS — I5023 Acute on chronic systolic (congestive) heart failure: Secondary | ICD-10-CM | POA: Diagnosis present

## 2022-04-27 DIAGNOSIS — I130 Hypertensive heart and chronic kidney disease with heart failure and stage 1 through stage 4 chronic kidney disease, or unspecified chronic kidney disease: Secondary | ICD-10-CM | POA: Diagnosis present

## 2022-04-27 DIAGNOSIS — E43 Unspecified severe protein-calorie malnutrition: Secondary | ICD-10-CM | POA: Diagnosis not present

## 2022-04-27 DIAGNOSIS — Z66 Do not resuscitate: Secondary | ICD-10-CM | POA: Diagnosis not present

## 2022-04-27 DIAGNOSIS — I480 Paroxysmal atrial fibrillation: Secondary | ICD-10-CM | POA: Diagnosis present

## 2022-04-27 DIAGNOSIS — Z951 Presence of aortocoronary bypass graft: Secondary | ICD-10-CM | POA: Diagnosis not present

## 2022-04-27 DIAGNOSIS — Z515 Encounter for palliative care: Secondary | ICD-10-CM | POA: Diagnosis not present

## 2022-04-27 DIAGNOSIS — R935 Abnormal findings on diagnostic imaging of other abdominal regions, including retroperitoneum: Secondary | ICD-10-CM | POA: Diagnosis not present

## 2022-04-27 DIAGNOSIS — R1013 Epigastric pain: Secondary | ICD-10-CM

## 2022-04-27 DIAGNOSIS — I959 Hypotension, unspecified: Secondary | ICD-10-CM | POA: Diagnosis present

## 2022-04-27 DIAGNOSIS — I5031 Acute diastolic (congestive) heart failure: Secondary | ICD-10-CM | POA: Diagnosis not present

## 2022-04-27 DIAGNOSIS — I5043 Acute on chronic combined systolic (congestive) and diastolic (congestive) heart failure: Secondary | ICD-10-CM | POA: Diagnosis present

## 2022-04-27 DIAGNOSIS — R109 Unspecified abdominal pain: Secondary | ICD-10-CM

## 2022-04-27 DIAGNOSIS — R627 Adult failure to thrive: Secondary | ICD-10-CM | POA: Diagnosis not present

## 2022-04-27 DIAGNOSIS — J449 Chronic obstructive pulmonary disease, unspecified: Secondary | ICD-10-CM | POA: Diagnosis present

## 2022-04-27 DIAGNOSIS — K561 Intussusception: Secondary | ICD-10-CM | POA: Diagnosis not present

## 2022-04-27 DIAGNOSIS — Z681 Body mass index (BMI) 19 or less, adult: Secondary | ICD-10-CM | POA: Diagnosis not present

## 2022-04-27 DIAGNOSIS — K449 Diaphragmatic hernia without obstruction or gangrene: Secondary | ICD-10-CM

## 2022-04-27 DIAGNOSIS — Z9981 Dependence on supplemental oxygen: Secondary | ICD-10-CM | POA: Diagnosis not present

## 2022-04-27 DIAGNOSIS — Z1152 Encounter for screening for COVID-19: Secondary | ICD-10-CM | POA: Diagnosis not present

## 2022-04-27 DIAGNOSIS — K319 Disease of stomach and duodenum, unspecified: Secondary | ICD-10-CM

## 2022-04-27 DIAGNOSIS — E785 Hyperlipidemia, unspecified: Secondary | ICD-10-CM | POA: Diagnosis present

## 2022-04-27 DIAGNOSIS — J9610 Chronic respiratory failure, unspecified whether with hypoxia or hypercapnia: Secondary | ICD-10-CM | POA: Diagnosis present

## 2022-04-27 DIAGNOSIS — I482 Chronic atrial fibrillation, unspecified: Secondary | ICD-10-CM | POA: Diagnosis not present

## 2022-04-27 DIAGNOSIS — E876 Hypokalemia: Secondary | ICD-10-CM | POA: Diagnosis present

## 2022-04-27 LAB — URINALYSIS, ROUTINE W REFLEX MICROSCOPIC
Bilirubin Urine: NEGATIVE
Glucose, UA: NEGATIVE mg/dL
Hgb urine dipstick: NEGATIVE
Ketones, ur: NEGATIVE mg/dL
Nitrite: NEGATIVE
Protein, ur: NEGATIVE mg/dL
Specific Gravity, Urine: 1.01 (ref 1.005–1.030)
pH: 5 (ref 5.0–8.0)

## 2022-04-27 LAB — URINALYSIS, MICROSCOPIC (REFLEX)

## 2022-04-27 LAB — TROPONIN I (HIGH SENSITIVITY): Troponin I (High Sensitivity): 32 ng/L — ABNORMAL HIGH (ref <18)

## 2022-04-27 LAB — LACTIC ACID, PLASMA: Lactic Acid, Venous: 1 mmol/L (ref 0.5–1.9)

## 2022-04-27 MED ORDER — LISINOPRIL 5 MG PO TABS
5.0000 mg | ORAL_TABLET | Freq: Every day | ORAL | Status: DC
  Administered 2022-04-27: 5 mg via ORAL
  Filled 2022-04-27: qty 1

## 2022-04-27 MED ORDER — WITCH HAZEL-GLYCERIN EX PADS
MEDICATED_PAD | CUTANEOUS | Status: DC | PRN
  Filled 2022-04-27: qty 100

## 2022-04-27 MED ORDER — FERROUS SULFATE 325 (65 FE) MG PO TABS
325.0000 mg | ORAL_TABLET | Freq: Every day | ORAL | Status: DC
  Filled 2022-04-27: qty 1

## 2022-04-27 MED ORDER — PANTOPRAZOLE SODIUM 40 MG IV SOLR: 40.0000 mg | Freq: Every day | INTRAVENOUS | Status: DC

## 2022-04-27 MED ORDER — BISACODYL 5 MG PO TBEC: 5.0000 mg | DELAYED_RELEASE_TABLET | Freq: Every day | ORAL | Status: DC | PRN

## 2022-04-27 MED ORDER — ACETAMINOPHEN 650 MG RE SUPP: 650.0000 mg | Freq: Four times a day (QID) | RECTAL | Status: DC | PRN

## 2022-04-27 MED ORDER — ONDANSETRON HCL 4 MG/2ML IJ SOLN: 4.0000 mg | Freq: Four times a day (QID) | INTRAMUSCULAR | Status: DC | PRN

## 2022-04-27 MED ORDER — FUROSEMIDE 20 MG PO TABS
20.0000 mg | ORAL_TABLET | Freq: Every day | ORAL | Status: DC
  Administered 2022-04-27 – 2022-04-28 (×2): 20 mg via ORAL
  Filled 2022-04-27 (×2): qty 1

## 2022-04-27 MED ORDER — APIXABAN 2.5 MG PO TABS
2.5000 mg | ORAL_TABLET | Freq: Two times a day (BID) | ORAL | Status: DC
  Administered 2022-04-27: 2.5 mg via ORAL
  Filled 2022-04-27: qty 1

## 2022-04-27 MED ORDER — ATORVASTATIN CALCIUM 10 MG PO TABS
20.0000 mg | ORAL_TABLET | Freq: Every day | ORAL | Status: DC
  Administered 2022-04-27: 20 mg via ORAL
  Filled 2022-04-27: qty 2

## 2022-04-27 MED ORDER — HYDRALAZINE HCL 20 MG/ML IJ SOLN: 5.0000 mg | INTRAMUSCULAR | Status: DC | PRN

## 2022-04-27 MED ORDER — PANTOPRAZOLE SODIUM 40 MG IV SOLR: 40.0000 mg | INTRAVENOUS | Status: DC

## 2022-04-27 MED ORDER — DOCUSATE SODIUM 100 MG PO CAPS
100.0000 mg | ORAL_CAPSULE | Freq: Two times a day (BID) | ORAL | Status: DC
  Administered 2022-04-27 – 2022-05-02 (×11): 100 mg via ORAL
  Filled 2022-04-27 (×11): qty 1

## 2022-04-27 MED ORDER — ONDANSETRON HCL 4 MG PO TABS: 4.0000 mg | ORAL_TABLET | Freq: Four times a day (QID) | ORAL | Status: DC | PRN

## 2022-04-27 MED ORDER — SODIUM CHLORIDE 0.9% FLUSH
3.0000 mL | Freq: Two times a day (BID) | INTRAVENOUS | Status: DC
  Administered 2022-04-27 – 2022-05-01 (×9): 3 mL via INTRAVENOUS

## 2022-04-27 MED ORDER — METHIMAZOLE 5 MG PO TABS
2.5000 mg | ORAL_TABLET | Freq: Every day | ORAL | Status: DC
  Administered 2022-04-27 – 2022-05-02 (×6): 2.5 mg via ORAL
  Filled 2022-04-27 (×9): qty 1

## 2022-04-27 MED ORDER — FUROSEMIDE 10 MG/ML IJ SOLN
20.0000 mg | Freq: Once | INTRAMUSCULAR | Status: AC
  Administered 2022-04-27: 20 mg via INTRAVENOUS
  Filled 2022-04-27: qty 2

## 2022-04-27 MED ORDER — LACTATED RINGERS IV SOLN: INTRAVENOUS | Status: DC

## 2022-04-27 MED ORDER — PANTOPRAZOLE SODIUM 40 MG PO TBEC
40.0000 mg | DELAYED_RELEASE_TABLET | Freq: Two times a day (BID) | ORAL | Status: DC
  Administered 2022-04-27: 40 mg via ORAL
  Filled 2022-04-27: qty 1

## 2022-04-27 MED ORDER — MORPHINE SULFATE (PF) 2 MG/ML IV SOLN: 2.0000 mg | INTRAVENOUS | Status: DC | PRN

## 2022-04-27 MED ORDER — SPIRONOLACTONE 12.5 MG HALF TABLET
12.5000 mg | ORAL_TABLET | Freq: Every day | ORAL | Status: DC
  Administered 2022-04-27 – 2022-05-02 (×6): 12.5 mg via ORAL
  Filled 2022-04-27 (×6): qty 1

## 2022-04-27 MED ORDER — ATORVASTATIN CALCIUM 10 MG PO TABS
20.0000 mg | ORAL_TABLET | Freq: Every day | ORAL | Status: DC
  Administered 2022-04-28 – 2022-05-01 (×4): 20 mg via ORAL
  Filled 2022-04-27 (×4): qty 2

## 2022-04-27 MED ORDER — METOPROLOL TARTRATE 25 MG PO TABS
25.0000 mg | ORAL_TABLET | Freq: Two times a day (BID) | ORAL | Status: DC
  Administered 2022-04-27 – 2022-05-02 (×11): 25 mg via ORAL
  Filled 2022-04-27 (×11): qty 1

## 2022-04-27 MED ORDER — SENNOSIDES-DOCUSATE SODIUM 8.6-50 MG PO TABS
1.0000 | ORAL_TABLET | Freq: Every day | ORAL | Status: DC
  Administered 2022-04-27 – 2022-05-02 (×6): 1 via ORAL
  Filled 2022-04-27 (×6): qty 1

## 2022-04-27 MED ORDER — POLYETHYLENE GLYCOL 3350 17 G PO PACK: 17.0000 g | PACK | Freq: Every day | ORAL | Status: DC | PRN

## 2022-04-27 MED ORDER — PANTOPRAZOLE SODIUM 40 MG IV SOLR
40.0000 mg | Freq: Every day | INTRAVENOUS | Status: DC
  Administered 2022-04-27 – 2022-04-28 (×2): 40 mg via INTRAVENOUS
  Filled 2022-04-27 (×2): qty 10

## 2022-04-27 MED ORDER — POTASSIUM CHLORIDE CRYS ER 20 MEQ PO TBCR
40.0000 meq | EXTENDED_RELEASE_TABLET | Freq: Once | ORAL | Status: AC
  Administered 2022-04-27: 40 meq via ORAL
  Filled 2022-04-27: qty 2

## 2022-04-27 MED ORDER — EMPAGLIFLOZIN 10 MG PO TABS
10.0000 mg | ORAL_TABLET | Freq: Every day | ORAL | Status: DC
  Administered 2022-04-27: 10 mg via ORAL
  Filled 2022-04-27 (×2): qty 1

## 2022-04-27 MED ORDER — ACETAMINOPHEN 325 MG PO TABS
650.0000 mg | ORAL_TABLET | Freq: Four times a day (QID) | ORAL | Status: DC | PRN
  Administered 2022-04-29 – 2022-05-01 (×3): 650 mg via ORAL
  Filled 2022-04-27 (×3): qty 2

## 2022-04-27 NOTE — Consult Note (Addendum)
Consultation  Referring Provider: [facility]/ [facility] Primary Care Physician:  System, Provider Not In Primary Gastroenterologist:  Dr.Wild/ [facility]  Reason for Consultation: Acute abdominal pain, abnormal CT imaging  HPI: Jane [name] is a 89 y.o. female, who was admitted through the emergency room last evening, brought in by her family with complaints of abdominal pain which has been present over the past couple of days.  She was also having a lot of repeated belching and burping.  She denies any nausea, did have 1 episode of vomiting a couple of days ago.  In addition she also had been noted to have increasing lower extremity edema and increased shortness of breath yesterday.  Her daughter says she took 5 nitroglycerin yesterday afternoon because she was having a hard time breathing.  She is usually on 2 L of O2 at home though does not use it 24/7.  She does have history of severe congestive heart failure, is status post pacemaker and ICD, has history of severe mitral regurgitation, chronic kidney disease stage III, coronary artery disease status post CABG 2016.  She has a total of 7 stents in.  Also is status post cholecystectomy.  Chest x-ray on admission showed cardiomegaly with mild to moderate severity of interstitial edema. Labs-WBC 10.0/hemoglobin 13.7/hematocrit 40/MCV 104 Potassium 3.2/36/creatinine 1.48 LFTs within normal limits BNP 1839 Troponins flat Lactate 1.0 And a short run of nonsustained V. tach this morning.  Patient is not having any ongoing abdominal pain today, she is very frustrated because she has been n.p.o. and says her mouth is so dry that she cannot breathe.  She continues to have burping and belching denies nausea. Family at bedside.  CT of the abdomen and pelvis on admission showed a moderate right effusion, small left effusion, she status post cholecystectomy normal-appearing liver.  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  There is dense gastric mucosa and a short segment of intussusception in the proximal duodenum just beyond the duodenal bulb, no dilated bowel.  Family relates that patient had been seen by GI at [facility]/Dr. Heide Guile in March 2023 with complaints of abdominal discomfort bloating and belching been present for several months.  Complained of a sense of fullness per notes.  Kersting in those notes it also states that when she feels very short of breath this occurs since she feels like if she cannot belch then she is "going to die".  She had been admitted at [facility] in November 2022 and had CT during that time to did not show any evidence of obstruction neoplasm or inflammation.  Was noted to have a hiatal hernia.  Also underwent upper GI that showed a small sliding hiatal hernia, benign stricture in the distal esophagus just proximal to the GE junction no evidence of gastric outlet obstruction otherwise negative.     Per the GI notes due to her advanced cardiac disease and arrhythmias and inoperable valvular disease she was not felt to be a candidate for endoscopic evaluation and was recommended for her to stay on a daily PPI and continue antacids as needed, stop carbonates.   Past Medical History:  Diagnosis Date   CHF (congestive heart failure) (HCC)    COPD (chronic obstructive pulmonary disease) (HCC)    Hyperlipidemia    Hypertension  Mitral regurgitation    Pneumonia    Thyroid disease     Past Surgical History:  Procedure Laterality Date   CHOLECYSTECTOMY     CORONARY ARTERY BYPASS GRAFT     INSERTION OF ICD      Prior to Admission medications   Medication Sig Start Date End Date Taking? Authorizing Provider  apixaban (ELIQUIS) 2.5 MG TABS tablet Take 2.5 mg by mouth 2 (two) times daily.   Yes [provider]   atorvastatin (LIPITOR) 20 MG tablet Take 20 mg by mouth every evening.   Yes [provider]  calcium carbonate (OSCAL) 1500 (600 Ca) MG TABS tablet Take 600 mg of elemental calcium by mouth at bedtime.   Yes [provider]  CYANOCOBALAMIN PO Take 1 tablet by mouth daily.   Yes [provider]  docusate sodium (COLACE) 50 MG capsule Take 50 mg by mouth every 3 (three) days.   Yes [provider]  empagliflozin (JARDIANCE) 10 MG TABS tablet Take 10 mg by mouth daily.   Yes [provider]  ferrous sulfate 325 (65 FE) MG tablet Take 325 mg by mouth daily with breakfast.   Yes [provider]  furosemide (LASIX) 20 MG tablet Take 20-40 mg by mouth daily. Pt alternates 20mg  one day and 40mg  the next day   Yes [provider]  lisinopril (PRINIVIL,ZESTRIL) 2.5 MG tablet Take 5 mg by mouth daily.   Yes [provider]  methimazole (TAPAZOLE) 5 MG tablet Take 2.5 mg by mouth daily. 06/12/20  Yes [provider]  metoprolol tartrate (LOPRESSOR) 25 MG tablet Take 25 mg by mouth 2 (two) times daily.   Yes [provider]  Multiple Vitamins-Minerals (PRESERVISION AREDS 2 PO) Take 1 tablet by mouth 2 (two) times daily.   Yes [provider]  nitroGLYCERIN (NITROSTAT) 0.4 MG SL tablet Place 0.4 mg under the tongue every 5 (five) minutes as needed for chest pain.   Yes [provider]  oxymetazoline (AFRIN) 0.05 % nasal spray Place 1 spray into both nostrils 2 (two) times daily as needed for congestion.   Yes [provider]  pantoprazole (PROTONIX) 40 MG tablet Take 40 mg by mouth 2 (two) times daily before a meal.   Yes [provider]  spironolactone (ALDACTONE) 25 MG tablet Take 12.5 mg by mouth daily.   Yes [provider]    Current Facility-Administered Medications  Medication Dose Route Frequency Provider Last Rate Last Admin   acetaminophen (TYLENOL) tablet 650 mg  650  mg Oral Q6H PRN Karmen Bongo, MD       Or   acetaminophen (TYLENOL) suppository 650 mg  650 mg Rectal Q6H PRN Karmen Bongo, MD       [START ON 04/28/2022] atorvastatin (LIPITOR) tablet 20 mg  20 mg Oral QHS Karmen Bongo, MD       bisacodyl (DULCOLAX) EC tablet 5 mg  5 mg Oral Daily PRN Karmen Bongo, MD       docusate sodium (COLACE) capsule 100 mg  100 mg Oral BID Karmen Bongo, MD   100 mg at 04/27/22 1159   furosemide (LASIX) tablet 20 mg  20 mg Oral Daily Karmen Bongo, MD   20 mg at 04/27/22 1047   hydrALAZINE (APRESOLINE) injection 5 mg  5 mg Intravenous Q4H PRN Karmen Bongo, MD       lactated ringers infusion   Intravenous Continuous Karmen Bongo, MD 75 mL/hr at 04/27/22 1143 New Bag at 04/27/22  1143   lisinopril (ZESTRIL) tablet 5 mg  5 mg Oral Daily Karmen Bongo, MD   5 mg at 04/27/22 1049   methimazole (TAPAZOLE) tablet 2.5 mg  2.5 mg Oral Daily Karmen Bongo, MD   2.5 mg at 04/27/22 1159   metoprolol tartrate (LOPRESSOR) tablet 25 mg  25 mg Oral BID Karmen Bongo, MD   25 mg at 04/27/22 1047   morphine (PF) 2 MG/ML injection 2 mg  2 mg Intravenous Q2H PRN Karmen Bongo, MD       ondansetron [facility]) tablet 4 mg  4 mg Oral Q6H PRN Karmen Bongo, MD       Or   ondansetron [facility]) injection 4 mg  4 mg Intravenous Q6H PRN Karmen Bongo, MD       pantoprazole (PROTONIX) injection 40 mg  40 mg Intravenous Daily Karmen Bongo, MD       polyethylene glycol (MIRALAX / GLYCOLAX) packet 17 g  17 g Oral Daily PRN Karmen Bongo, MD       senna-docusate (Senokot-S) tablet 1 tablet  1 tablet Oral Daily Karmen Bongo, MD   1 tablet at 04/27/22 1048   sodium chloride flush (NS) 0.9 % injection 3 mL  3 mL Intravenous Q12H Karmen Bongo, MD   3 mL at 04/27/22 1157   spironolactone (ALDACTONE) tablet 12.5 mg  12.5 mg Oral Daily Karmen Bongo, MD   12.5 mg at 04/27/22 1049    Allergies as of 04/26/2022   (No Known Allergies)    No family history on  file.  Social History   Socioeconomic History   Marital status: Widowed    Spouse name: Not on file   Number of children: Not on file   Years of education: Not on file   Highest education level: Not on file  Occupational History   Not on file  Tobacco Use   Smoking status: Never   Smokeless tobacco: Never  Substance and Sexual Activity   Alcohol use: Not Currently   Drug use: Not Currently   Sexual activity: Not on file  Other Topics Concern   Not on file  Social History Narrative   Not on file   Social Determinants of Health   Financial Resource Strain: Not on file  Food Insecurity: Not on file  Transportation Needs: Not on file  Physical Activity: Not on file  Stress: Not on file  Social Connections: Not on file  Intimate Partner Violence: Not on file    Review of Systems: Pertinent positive and negative review of systems were noted in the above HPI section.  All other review of systems was otherwise negative.   Physical Exam: Vital signs in last 24 hours: Temp:  [97.6 F (36.4 C)-98.6 F (37 C)] 97.7 F (36.5 C) (03/18 0922) Pulse Rate:  [73-88] 75 (03/18 0922) Resp:  [17-29] 24 (03/18 0730) BP: (100-136)/(52-83) 109/62 (03/18 0922) SpO2:  [96 %-100 %] 96 % (03/18 0922)   General:   Alert,  Well-developed, very frail, small very elderly white female pleasant and cooperative in NAD, uncomfortable appearing, belching, family at bedside Head:  Normocephalic and atraumatic. Eyes:  Sclera clear, no icterus.   Conjunctiva pink. Ears:  Normal auditory acuity. Nose:  No deformity, discharge,  or lesions. Mouth:  No deformity or lesions.   Neck:  Supple; no masses or thyromegaly. Lungs: Decreased breath sounds bilaterally greater than left, few faint Rales  heart:  Regular rate and rhythm; loud systolic murmur Abdomen:  Soft, no focal tenderness  nondistended, BS active,nonpalp mass or hsm.   Rectal:  not Done Msk:  Symmetrical without gross deformities.  . Pulses/ext: 1+edema bilateral lower extremities  Neurologic:  Alert and  oriented x4;  grossly normal neurologically. Skin:  Intact without significant lesions or rashes.. Psych:  Alert and cooperative. Normal mood and affect.  Intake/Output from previous day: 03/17 0701 - 03/18 0700 In: -  Out: 350 [Urine:350] Intake/Output this shift: Total I/O In: -  Out: 150 [Urine:150]  Lab Results: Recent Labs    04/26/22 2122  WBC 10.0  HGB 13.7  HCT 44.4  PLT 185   BMET Recent Labs    04/26/22 2122  NA 141  K 3.2*  CL 108  CO2 23  GLUCOSE 118*  BUN 36*  CREATININE 1.47*  CALCIUM 9.1   LFT Recent Labs    04/26/22 2122  PROT 7.1  ALBUMIN 3.4*  AST 16  ALT 15  ALKPHOS 65  BILITOT 1.1   PT/INR No results for input(s): "LABPROT", "INR" in the last 72 hours. Hepatitis Panel No results for input(s): "HEPBSAG", "HCVAB", "HEPAIGM", "HEPBIGM" in the last 72 hours.    IMPRESSION:  #32  89 year old white female with history of biventricular congestive heart failure, coronary artery disease status post CABG 2016, history of atrial fibrillation, she is status post pacemaker and ICD placement, has history of severe mitral regurgitation, aortic stenosis, chronic respiratory failure on O2 2 L chronically.  To the ER yesterday evening with progressive complaints of shortness of breath, difficulty getting a breath no clear chest pain though she had taken 5 nitroglycerin at home yesterday afternoon.,  Also was having belching and burping which had been present over the previous couple of days along with intermittent complaints of lower abdominal pain and 1 episode of vomiting which occurred a couple of days ago.  Her daughter states that she has had these episodes of belching and burping in the past sometimes in coordination with acute congestive heart failure, other times not.  Query if some of the acute GI symptoms could be secondary to relative mesenteric insufficiency in  setting of congestive heart failure  She had undergone evaluation at [facility] in March 2023 for recurrent belching and burping, she underwent upper GI as outlined above, and also had had a prior CT there November 2022 with no concerning findings but was noted to have a small hiatal hernia and mild distal esophageal stricture.  Decision at that time was to manage her conservatively and not try to pursue EGD due to her multiple comorbidities.  She was advised to continue a PPI and avoid carbonates.  Workup this admission is consistent with acute on chronic congestive heart failure And I suspect majority of her symptoms are secondary to this.  # 2 abnormal CT with finding of what appears to be a gastric diverticulum and then CT also mentioned a very short segment of intussusception of the duodenum just beyond the bulb .  This may be a transient finding, could not rule out small associated lesion as a lead point. No evidence of obstruction.  #3 bilateral pleural effusions right greater than left #4.  Large left renal cyst #5  Anticoagulation-on Eliquis-dose yesterday  Plan; Patient is a poor endoscopic candidate due to multiple comorbidities as outlined above and acute congestive heart failure would leave her on daily PPI Start clear liquid diet and advance slowly as tolerates Repeat abdominal films in a.m. No plans for endoscopic evaluation at this time. Will follow-up in a.m.  Amy EsterwoodPA-C 04/27/2022, 12:50 PM   Attending physician's note  I have taken a history, reviewed the chart and examined the patient. I performed a substantive portion of this encounter, including complete performance of at least one of the key components, in conjunction with the APP. I agree with the APP's note, impression and recommendations.    89 year old very pleasant female with severe congestive heart failure, A-fib s/p ICD, aortic stenosis, severe mitral regurgitation, chronic respiratory failure on  home O2  Complains of intermittent nausea, abdominal bloating, dyspepsia, early satiety and excessive belching Her symptoms are likely secondary to severe CHF/right heart failure, they are somewhat improved with diuresis since admission  Noted incidental finding of possible short segment intussusception of the duodenum She is currently asymptomatic She is at significant risk for anesthesia and procedure related complications with EGD, can consider upper GI series instead which will likely be both diagnostic and therapeutic.  On anticoagulation with Eliquis  The patient was provided an opportunity to ask questions and all were answered. The patient agreed with the plan and demonstrated an understanding of the instructions.  Damaris Hippo , MD [phone]

## 2022-04-27 NOTE — ED Notes (Signed)
[facility] here to get pt 

## 2022-04-27 NOTE — ED Notes (Signed)
ED TO INPATIENT HANDOFF REPORT  ED Nurse Name and Phone #: Kaiyah Eber RN  S Name/Age/Gender Jane [name] 89 y.o. female Room/Bed: MH10/MH10  Code Status   Code Status: Not on file  Home/SNF/Other Home Patient oriented to: self, place, time, and situation Is this baseline? Yes   Triage Complete: Triage complete  Chief Complaint Acute on chronic systolic (congestive) heart failure ([place]) [I50.23]  Triage Note Pt here with daughters for RLQ pain that radiates into R leg, increased belching, and bilateral lower leg swelling. Pt has hx of CHF, pacemaker, 7 stents, COPD. Pt has oxygen at home that she's supposed to wear at night but pt refuses. Per daughters, pt told them she took 5 nitroglycerin's yesterday to help with her abdominal pain.    Allergies No Known Allergies  Level of Care/Admitting Diagnosis ED Disposition     ED Disposition  Admit   Condition  --   Comment  Hospital Area: [place] [[id number]]  Level of Care: Progressive [102]  Admit to Progressive based on following criteria: Other see comments  Comments: Acute chf  May [facility] patient to [facility] or [facility] if equivalent level of care is available:: No  Interfacility transfer: Yes  Covid Evaluation: Asymptomatic - no recent exposure (last 10 days) testing not required  Diagnosis: Acute on chronic systolic (congestive) heart failure [facility]) [id number]  Admitting Physician: Christel Mormon [id number]  Attending Physician: Christel Mormon [id number]  Certification:: I certify this patient will need inpatient services for at least 2 midnights  Estimated Length of Stay: 2          B Medical/Surgery History Past Medical History:  Diagnosis Date   CHF (congestive heart failure) ([place])    Hyperlipidemia    Hypertension    Mitral regurgitation    Pneumonia    Thyroid disease    Past Surgical History:  Procedure Laterality Date   CHOLECYSTECTOMY     CORONARY ARTERY BYPASS GRAFT      PACEMAKER INSERTION       A IV Location/Drains/Wounds Patient Lines/Drains/Airways Status     Active Line/Drains/Airways     Name Placement date Placement time Site Days   Peripheral IV 04/26/22 20 G 1" Anterior;Proximal;Right Forearm 04/26/22  2236  Forearm  1   External Urinary Catheter 04/27/22  0144  --  less than 1            Intake/Output Last 24 hours  Intake/Output Summary (Last 24 hours) at 04/27/2022 [id number] Last data filed at 04/27/2022 [id number] Gross per 24 hour  Intake --  Output 350 ml  Net -350 ml    Labs/Imaging Results for orders placed or performed during the hospital encounter of 04/26/22 (from the past 48 hour(s))  Lipase, blood     Status: None   Collection Time: 04/26/22  9:22 PM  Result Value Ref Range   Lipase 36 11 - 51 U/L    Comment: Performed at [facility], [address] [place], [place] [address]  Comprehensive metabolic panel     Status: Abnormal   Collection Time: 04/26/22  9:22 PM  Result Value Ref Range   Sodium 141 135 - 145 mmol/L   Potassium 3.2 (L) 3.5 - 5.1 mmol/L   Chloride 108 98 - 111 mmol/L   CO2 23 22 - 32 mmol/L   Glucose, Bld 118 (H) 70 - 99 mg/dL    Comment: Glucose reference range applies only to samples taken after fasting for  at least 8 hours.   BUN 36 (H) 8 - 23 mg/dL   Creatinine, Ser 1.47 (H) 0.44 - 1.00 mg/dL   Calcium 9.1 8.9 - 10.3 mg/dL   Total Protein 7.1 6.5 - 8.1 g/dL   Albumin 3.4 (L) 3.5 - 5.0 g/dL   AST 16 15 - 41 U/L   ALT 15 0 - 44 U/L   Alkaline Phosphatase 65 38 - 126 U/L   Total Bilirubin 1.1 0.3 - 1.2 mg/dL   GFR, Estimated 34 (L) >60 mL/min    Comment: (NOTE) Calculated using the CKD-EPI Creatinine Equation (2021)    Anion gap 10 5 - 15    Comment: Performed at [facility], [address] [place], [place] [address]  CBC     Status: Abnormal   Collection Time: 04/26/22  9:22 PM  Result Value Ref Range   WBC 10.0 4.0 - 10.5 K/uL   RBC 4.24 3.87 - 5.11 MIL/uL    Hemoglobin 13.7 12.0 - 15.0 g/dL   HCT 44.4 36.0 - 46.0 %   MCV 104.7 (H) 80.0 - 100.0 fL   MCH 32.3 26.0 - 34.0 pg   MCHC 30.9 30.0 - 36.0 g/dL   RDW 16.1 (H) 11.5 - 15.5 %   Platelets 185 150 - 400 K/uL   nRBC 0.4 (H) 0.0 - 0.2 %    Comment: Performed at [facility], [address] [place], [place] [address]  Brain natriuretic peptide     Status: Abnormal   Collection Time: 04/26/22 10:41 PM  Result Value Ref Range   B Natriuretic Peptide 1,839.9 (H) 0.0 - 100.0 pg/mL    Comment: Performed at [facility], [address] [place], [place] [address]  Troponin I (High Sensitivity)     Status: Abnormal   Collection Time: 04/26/22 10:41 PM  Result Value Ref Range   Troponin I (High Sensitivity) 38 (H) <18 ng/L    Comment: (NOTE) Elevated high sensitivity troponin I (hsTnI) values and significant  changes across serial measurements may suggest ACS but many other  chronic and acute conditions are known to elevate hsTnI results.  Refer to the "Links" section for chest pain algorithms and additional  guidance. Performed at [facility], [address] [place], [place] [address]   Lactic acid, plasma     Status: None   Collection Time: 04/26/22 10:42 PM  Result Value Ref Range   Lactic Acid, Venous 1.0 0.5 - 1.9 mmol/L    Comment: Performed at [facility], [address] [place], [place] [address]  Resp panel by RT-PCR (RSV, Flu A&B, Covid) Anterior Nasal Swab     Status: None   Collection Time: 04/26/22 10:42 PM   Specimen: Anterior Nasal Swab  Result Value Ref Range   SARS Coronavirus 2 by RT PCR NEGATIVE NEGATIVE    Comment: (NOTE) SARS-CoV-2 target nucleic acids are NOT DETECTED.  The SARS-CoV-2 RNA is generally detectable in upper respiratory specimens during the acute phase of infection. The lowest concentration of SARS-CoV-2 viral copies this assay can detect is 138 copies/mL. A negative result does not preclude  SARS-Cov-2 infection and should not be used as the sole basis for treatment or other patient management decisions. A negative result may occur with  improper specimen collection/handling, submission of specimen other than nasopharyngeal swab, presence of viral mutation(s) within the areas targeted by this assay, and inadequate number of viral copies(<138  copies/mL). A negative result must be combined with clinical observations, patient history, and epidemiological information. The expected result is Negative.  Fact Sheet for Patients:  EntrepreneurPulse.com.au  Fact Sheet for Healthcare Providers:  IncredibleEmployment.be  This test is no t yet approved or cleared by the [place] FDA and  has been authorized for detection and/or diagnosis of SARS-CoV-2 by FDA under an Emergency Use Authorization (EUA). This EUA will remain  in effect (meaning this test can be used) for the duration of the COVID-19 declaration under Section 564(b)(1) of the Act, 21 U.S.C.section 360bbb-3(b)(1), unless the authorization is terminated  or revoked sooner.       Influenza A by PCR NEGATIVE NEGATIVE   Influenza B by PCR NEGATIVE NEGATIVE    Comment: (NOTE) The Xpert Xpress SARS-CoV-2/FLU/RSV plus assay is intended as an aid in the diagnosis of influenza from Nasopharyngeal swab specimens and should not be used as a sole basis for treatment. Nasal washings and aspirates are unacceptable for Xpert Xpress SARS-CoV-2/FLU/RSV testing.  Fact Sheet for Patients: EntrepreneurPulse.com.au  Fact Sheet for Healthcare Providers: IncredibleEmployment.be  This test is not yet approved or cleared by the [place] FDA and has been authorized for detection and/or diagnosis of SARS-CoV-2 by FDA under an Emergency Use Authorization (EUA). This EUA will remain in effect (meaning this test can be used) for the duration of the COVID-19  declaration under Section 564(b)(1) of the Act, 21 U.S.C. section 360bbb-3(b)(1), unless the authorization is terminated or revoked.     Resp Syncytial Virus by PCR NEGATIVE NEGATIVE    Comment: (NOTE) Fact Sheet for Patients: EntrepreneurPulse.com.au  Fact Sheet for Healthcare Providers: IncredibleEmployment.be  This test is not yet approved or cleared by the [place] FDA and has been authorized for detection and/or diagnosis of SARS-CoV-2 by FDA under an Emergency Use Authorization (EUA). This EUA will remain in effect (meaning this test can be used) for the duration of the COVID-19 declaration under Section 564(b)(1) of the Act, 21 U.S.C. section 360bbb-3(b)(1), unless the authorization is terminated or revoked.  Performed at [facility], [address] [place], [place] [address]   Lactic acid, plasma     Status: None   Collection Time: 04/27/22 12:43 AM  Result Value Ref Range   Lactic Acid, Venous 1.0 0.5 - 1.9 mmol/L    Comment: Performed at [facility], [address] [place], [place] [address]  Troponin I (High Sensitivity)     Status: Abnormal   Collection Time: 04/27/22  2:02 AM  Result Value Ref Range   Troponin I (High Sensitivity) 32 (H) <18 ng/L    Comment: (NOTE) Elevated high sensitivity troponin I (hsTnI) values and significant  changes across serial measurements may suggest ACS but many other  chronic and acute conditions are known to elevate hsTnI results.  Refer to the "Links" section for chest pain algorithms and additional  guidance. Performed at [facility], [address] [place], [place] [address]   Urinalysis, Routine w reflex microscopic -Urine, Clean Catch     Status: Abnormal   Collection Time: 04/27/22  5:12 AM  Result Value Ref Range   Color, Urine YELLOW YELLOW   APPearance CLEAR CLEAR   Specific Gravity, Urine 1.010 1.005 - 1.030   pH 5.0 5.0 - 8.0    Glucose, UA NEGATIVE NEGATIVE mg/dL   Hgb urine dipstick NEGATIVE NEGATIVE   Bilirubin Urine NEGATIVE NEGATIVE   Ketones, ur NEGATIVE NEGATIVE mg/dL  Protein, ur NEGATIVE NEGATIVE mg/dL   Nitrite NEGATIVE NEGATIVE   Leukocytes,Ua TRACE (A) NEGATIVE    Comment: Performed at [facility], [address] [place], [place] [address]  Urinalysis, Microscopic (reflex)     Status: Abnormal   Collection Time: 04/27/22  5:12 AM  Result Value Ref Range   RBC / HPF 0-5 0 - 5 RBC/hpf   WBC, UA 6-10 0 - 5 WBC/hpf   Bacteria, UA FEW (A) NONE SEEN   Squamous Epithelial / HPF 0-5 0 - 5 /HPF    Comment: Performed at [facility], [address] [place], [place] [address]   DG Chest Portable 1 View  Result Date: 04/26/2022 CLINICAL DATA:  Right lower quadrant pain that radiates into the right leg. EXAM: PORTABLE CHEST 1 VIEW COMPARISON:  None Available. FINDINGS: Radiopaque sternal fixation plates and screws and vascular clips are present. A multi lead AICD is also seen. There is mild to moderate severity enlargement of the cardiac silhouette with marked severity calcification of the thoracic aorta. Mild to moderate severity diffusely increased interstitial lung markings are seen. Mild atelectasis and/or infiltrate is also present within the mid to lower right lung and left lung base. There is no evidence of a pleural effusion or pneumothorax. Multilevel degenerative changes seen throughout the thoracic spine. IMPRESSION: 1. Cardiomegaly and evidence of prior median sternotomy/CABG with mild to moderate severity interstitial edema. 2. Mild bilateral atelectasis and/or infiltrate. Electronically Signed   By: Virgina Norfolk M.D.   On: 04/26/2022 23:31   CT ABDOMEN PELVIS W CONTRAST  Result Date: 04/26/2022 CLINICAL DATA:  Right lower quadrant pain. EXAM: CT ABDOMEN AND PELVIS WITH CONTRAST TECHNIQUE: Multidetector CT imaging of the abdomen and pelvis was performed using the standard  protocol following bolus administration of intravenous contrast. RADIATION DOSE REDUCTION: This exam was performed according to the departmental dose-optimization program which includes automated exposure control, adjustment of the mA and/or kV according to patient size and/or use of iterative reconstruction technique. CONTRAST:  23mL OMNIPAQUE IOHEXOL 300 MG/ML  SOLN COMPARISON:  None Available. FINDINGS: Lower chest: Moderate to marked severity areas of scarring and/or atelectasis are seen within the bilateral lung bases. There is a moderate-sized right-sided pleural effusion. A very small left pleural effusion is also noted. Hepatobiliary: No focal liver abnormality is seen. Status post cholecystectomy. No biliary dilatation. Pancreas: Unremarkable. No pancreatic ductal dilatation or surrounding inflammatory changes. Spleen: Normal in size without focal abnormality. Adrenals/Urinary Tract: The right adrenal gland is unremarkable. There is mild diffuse left adrenal gland enlargement. Kidneys are normal in size, without renal calculi or hydronephrosis. A 6.2 cm x 6.2 cm x 9.7 cm lobulated, multi septated, exophytic cyst is seen extending from the lower pole of the left kidney. Numerous subcentimeter bilateral simple renal cysts are also seen. The urinary bladder is poorly distended and is otherwise unremarkable. Stomach/Bowel: A markedly thickened hypodense gastric antrum is seen (approximately 59.86 Hounsfield units). A 1.7 cm x 1.6 cm x 1.4 cm fluid-filled area of outpouching is noted along the dorsal aspect of the gastric antrum (axial CT image 31, CT series 2/coronal reformatted image 56, CT series 5). Diffusely dense gastric mucosa is also seen. The appendix is not clearly identified. A short segment of intussuscepted proximal duodenum is noted just beyond the duodenal bulb (best seen on coronal reformatted images 50 through 55, CT series 5). No evidence of bowel dilatation. Noninflamed diverticula are seen  throughout the sigmoid colon. Vascular/Lymphatic: Aortic  atherosclerosis. No enlarged abdominal or pelvic lymph nodes. Reproductive: Multiple heterogeneous uterine fibroids are seen. The largest measures approximately 2.7 cm by 2.0 cm x 1.6 cm and is seen within the lateral aspect of the uterus on the left. The bilateral adnexa are unremarkable. Other: No abdominal wall hernia or abnormality. No abdominopelvic ascites. Musculoskeletal: Multilevel degenerative changes are seen throughout the lumbar spine. IMPRESSION: 1. 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 Sequelae associated with a large gastric ulcer and/or an underlying neoplastic process cannot be excluded. Correlation with upper endoscopy is recommended. 2. Short segment of intussusception involving the proximal duodenum. This may be transient in nature. 3. Sigmoid diverticulosis. 4. Multiple heterogeneous uterine fibroids. Correlation with nonemergent pelvic ultrasound is recommended. 5. Moderate to marked severity bibasilar scarring and/or atelectasis. 6. Bilateral pleural effusions, right greater than left. 7. Evidence of prior cholecystectomy. 8. Large, lobulated, multi septated, exophytic left renal cyst. Further evaluation with nonemergent renal ultrasound is recommended. 9. Aortic atherosclerosis. Aortic Atherosclerosis (ICD10-I70.0). Electronically Signed   By: Virgina Norfolk M.D.   On: 04/26/2022 23:27    Pending Labs Unresulted Labs (From admission, onward)     Start     Ordered   04/26/22 2220  Urine Culture (for pregnant, neutropenic or urologic patients or patients with an indwelling urinary catheter)  (Urine Labs)  Once,   URGENT       Question:  Indication  Answer:  Suprapubic pain   04/26/22 2221            Vitals/Pain Today's Vitals   04/27/22 0330 04/27/22 0400 04/27/22 0430 04/27/22 0530  BP: 120/71 116/66 128/71 119/66  Pulse: 88 80 86 80  Resp: (!) 27 (!) 27 (!) 29 (!) 23  Temp:       TempSrc:      SpO2: 98% 99% 98% 100%  PainSc:        Isolation Precautions No active isolations  Medications Medications  apixaban (ELIQUIS) tablet 2.5 mg (has no administration in time range)  atorvastatin (LIPITOR) tablet 20 mg (has no administration in time range)  empagliflozin (JARDIANCE) tablet 10 mg (has no administration in time range)  ferrous sulfate tablet 325 mg (has no administration in time range)  furosemide (LASIX) tablet 20 mg (has no administration in time range)  lisinopril (ZESTRIL) tablet 5 mg (has no administration in time range)  methimazole (TAPAZOLE) tablet 2.5 mg (has no administration in time range)  metoprolol tartrate (LOPRESSOR) tablet 25 mg (has no administration in time range)  pantoprazole (PROTONIX) EC tablet 40 mg (has no administration in time range)  spironolactone (ALDACTONE) tablet 12.5 mg (has no administration in time range)  senna-docusate (Senokot-S) tablet 1 tablet (has no administration in time range)  iohexol (OMNIPAQUE) 300 MG/ML solution 60 mL (60 mLs Intravenous Contrast Given 04/26/22 2244)  furosemide (LASIX) injection 20 mg (20 mg Intravenous Given 04/27/22 0201)    Mobility walks with person assist     Focused Assessments Cardiac Assessment Handoff:    No results found for: "CKTOTAL", "CKMB", "CKMBINDEX", "TROPONINI" No results found for: "DDIMER" Does the Patient currently have chest pain? No    R Recommendations: See Admitting Provider Note  Report given to:   Additional Notes:

## 2022-04-27 NOTE — TOC Progression Note (Signed)
Transition of Care [facility]) - Progression Note    Patient Details  Name: Emberlyn Ouk MRN: [id number] Date of Birth: 21-Feb-1933  Transition of [facility]) CM/SW Contact  Zenon Mayo, RN Phone Number: 04/27/2022, 4:37 PM  Clinical Narrative:    From home with daughter, has home oxygen but they brought this on their own she does not use it at this time per daughter.  Patient wants NCM to speak with daughter.  NCM offered choice to daughter , she is ok with [facility], NCM made referral to [facility] , he is able to take referral.  She has a walker at home also.         Expected Discharge Plan and Services                                               Social Determinants of Health (SDOH) Interventions SDOH Screenings   Tobacco Use: Low Risk  (04/27/2022)    Readmission Risk Interventions     No data to display

## 2022-04-27 NOTE — Evaluation (Signed)
Physical Therapy Evaluation Patient Details Name: Jane [name] MRN: [id number] DOB: [birth year] Today's Date: 04/27/2022  History of Present Illness  Patient is a 89 y/o female who presents on 3/17 with RLQ pain and worsening dyspnea with associated edema. Found to have acute on chronic CHF.  Abdominal CT scan-evidence of gastric ulceration and possible jejunal intussusception however MD believes it is malignancy, awaiting GI consult. PMHincludes COPD, CHF, HTN, mitral regurgitation.  Clinical Impression  Patient presents with generalized weakness, impaired balance, decreased activity tolerance and impaired mobility s/p above. Pt lives at home with her daughter and reports being Mod I for ADLs up until 3 weeks ago but recently has needed assist. Also reports furniture walking and HHA for support. Pt has needed to be carried up the stairs recently due to weakness. Today, pt tolerated transfers and gait training with min guard-Min A. Balance seemed to be improved with BUE support. Will trial RW vs rollator next session as likely will need to transition to using DME to prevent falls and increase independence at home. SP02 dropped to 82% on RA with activity, cues for pursed lip breathing and donned 2L 02 post walk.  Will follow acutely to maximize independence and mobility prior to return home.      Recommendations for follow up therapy are one component of a multi-disciplinary discharge planning process, led by the attending physician.  Recommendations may be updated based on patient status, additional functional criteria and insurance authorization.  Follow Up Recommendations Home health PT      Assistance Recommended at Discharge Intermittent Supervision/Assistance  Patient can return home with the following  A little help with walking and/or transfers;A little help with bathing/dressing/bathroom;Help with stairs or ramp for entrance;Assist for transportation;Assistance with cooking/housework;Direct  supervision/assist for medications management;Direct supervision/assist for financial management    Equipment Recommendations Rolling walker (2 wheels) (vs rollator pending trial next session)  Recommendations for Other Services       Functional Status Assessment Patient has had a recent decline in their functional status and demonstrates the ability to make significant improvements in function in a reasonable and predictable amount of time.     Precautions / Restrictions Precautions Precautions: Fall;Other (comment) Precaution Comments: watch 02 Restrictions Weight Bearing Restrictions: No      Mobility  Bed Mobility Overal bed mobility: Modified Independent             General bed mobility comments: no assist needed, use of rails.    Transfers Overall transfer level: Needs assistance Equipment used: None Transfers: Sit to/from Stand Sit to Stand: Min guard           General transfer comment: Min guard for safety. Stood from EOB x1, slow to rise.    Ambulation/Gait Ambulation/Gait assistance: Min assist, Min guard Gait Distance (Feet): 150 Feet Assistive device: Rolling walker (2 wheels), None Gait Pattern/deviations: Step-through pattern, Decreased stride length Gait velocity: decreased Gait velocity interpretation: <1.8 ft/sec, indicate of risk for recurrent falls   General Gait Details: Slow, unsteady, guarded gait without use of DME needing Min A, balance improved with use of RW needing Min guard assist. SP02 dropped to 82% on RA with activity, cues for pursed lip breathing and donned 2L 02 post walk.  Stairs            Wheelchair Mobility    Modified Rankin (Stroke Patients Only)       Balance Overall balance assessment: Needs assistance Sitting-balance support: Feet supported, No upper extremity supported Sitting balance-Leahy Scale:  Good     Standing balance support: During functional activity Standing balance-Leahy Scale:  Poor Standing balance comment: Able to stand statically but does better with UE support for walking                             Pertinent Vitals/Pain Pain Assessment Pain Assessment: No/denies pain    Home Living Family/patient expects to be discharged to:: Private residence Living Arrangements: Children (daughter and son in law) Available Help at Discharge: Family;Available 24 hours/day Type of Home: House Home Access: Stairs to enter Entrance Stairs-Rails: None Entrance Stairs-Number of Steps: 2 Alternate Level Stairs-Number of Steps: 16 total Home Layout: Two level;Bed/bath upstairs Home Equipment: Transport chair;Grab bars - tub/shower;Shower seat      Prior Function Prior Level of Function : Needs assist       Physical Assist : ADLs (physical)   ADLs (physical): Bathing;Dressing Mobility Comments: Pt family reports Pt was independent in the home, ambulating with no AD.  Pt would use transport chair for longer community distances, furniture walking more recently or HHA per daughter ADLs Comments: Patient was independent in bADLs up until 3 weeks ago, no her daughter has been assisting with dressing and bathing     Hand Dominance        Extremity/Trunk Assessment   Upper Extremity Assessment Upper Extremity Assessment: Defer to OT evaluation    Lower Extremity Assessment Lower Extremity Assessment: Generalized weakness (but functional)    Cervical / Trunk Assessment Cervical / Trunk Assessment: Kyphotic  Communication   Communication: No difficulties  Cognition Arousal/Alertness: Awake/alert Behavior During Therapy: WFL for tasks assessed/performed Overall Cognitive Status: Impaired/Different from baseline Area of Impairment: Memory, Orientation, Following commands                 Orientation Level: Time, Disoriented to   Memory: Decreased short-term memory Following Commands: Follows one step commands with increased time        General Comments: Daughter reports "minor cognitive impairments" recently. Tangential at times.        General Comments General comments (skin integrity, edema, etc.): 2 daughters present during session. SP02 dropped to 82% on RA with activity, cues for pursed lip breathing and donned 2L 02 post walk.    Exercises     Assessment/Plan    PT Assessment Patient needs continued PT services  PT Problem List Decreased strength;Decreased mobility;Decreased safety awareness;Decreased balance;Decreased knowledge of use of DME;Decreased cognition;Decreased activity tolerance;Cardiopulmonary status limiting activity       PT Treatment Interventions DME instruction;Therapeutic activities;Gait training;Cognitive remediation;Therapeutic exercise;Patient/family education;Balance training;Stair training;Functional mobility training    PT Goals (Current goals can be found in the Care Plan section)  Acute Rehab PT Goals Patient Stated Goal: to go home PT Goal Formulation: With patient/family Time For Goal Achievement: 05/11/22 Potential to Achieve Goals: Good    Frequency Min 1X/week     Co-evaluation               AM-PAC PT "6 Clicks" Mobility  Outcome Measure Help needed turning from your back to your side while in a flat bed without using bedrails?: None Help needed moving from lying on your back to sitting on the side of a flat bed without using bedrails?: None Help needed moving to and from a bed to a chair (including a wheelchair)?: A Little Help needed standing up from a chair using your arms (e.g., wheelchair or bedside chair)?: A Little Help  needed to walk in hospital room?: A Little Help needed climbing 3-5 steps with a railing? : A Lot 6 Click Score: 19    End of Session Equipment Utilized During Treatment: Gait belt;Oxygen Activity Tolerance: Treatment limited secondary to medical complications (Comment) (drop in SP02) Patient left: in bed;with call bell/phone within  reach;with bed alarm set;with family/visitor present Nurse Communication: Mobility status PT Visit Diagnosis: Difficulty in walking, not elsewhere classified (R26.2);Unsteadiness on feet (R26.81)    Time: [id number] PT Time Calculation (min) (ACUTE ONLY): 21 min   Charges:   PT Evaluation $PT Eval Moderate Complexity: 1 Mod          Marisa Severin, PT, DPT Acute Rehabilitation Services Secure chat preferred Office [place] 04/27/2022, 3:49 PM

## 2022-04-27 NOTE — H&P (Signed)
History and Physical    Patient: Jane [name] [id number] DOB: 04/25/1933 DOA: 04/26/2022 DOS: the patient was seen and examined on 04/27/2022 PCP: System, Provider Not In  Patient coming from: Home - lives with daughter; Donald Prose: Daughters, [phone]   Chief Complaint: Abdominal pain  HPI: Jane [name] is a 89 y.o. female with medical history significant of HTN, HLD, COPD on 2L home O2, CAD s/p stents, afib on Eliquis, pacemaker.AICD placement, and h/o chronic diastolic CHF presenting with abdominal pain.  She has had a number of long-term cardiac issues.  In recent weeks, poor PO intake.  Family was concerned that she was taking on fluid and she was having severe RLQ pain with radiation down the R leg.  Yesterday she was having severe chest pain.  Currently, pain not present.  She does have hemorrhoids.  Normal BMs - some constipation during the last week.  Significant belching.  +pedal edema, L >R.  She has been more sensitive to touch.  Last weekend she had n/v, none in over a week.  She is having no appetite and has lost 20 pounds in a year.  +early satiety.  No dysphagia.  Saturday AM they gave her 5 NTG back to back due to excessive belching, felt like she was having chest pressure.  She took gaviscon yesterday and her chest pressure got better.  She has a hiatal hernia, deemed not a candidate for simultaneous EGD/colonoscopy due to cardiology. Her specialists are at [facility].    ER Course:  [facility] to [facility] transfer, per Dr. Sidney Ace:  Acute onset of worsening dyspnea/edema, BNP 1800.  She has been having severe intermittent abdominal pain and her abdominal CT scan showed evidence of gastric ulceration and possible jejunal intussusception.  Dr. Brantley Stage with general surgery was notified and is aware about the patient.  He did not think it is a surgical problem at this time and felt it was less likely intussusception and more likely possible malignancy.  He felt GI consultation in the morning would be  reasonable and the patient may need endoscopy and general surgery team will see the patient in consultation as well.   Given IV Lasix.  On 2L O2 (same as home).  She was started on a small dose of 20 mg of IV Lasix as her family stated that her blood pressure dropped with Lasix in the past.   CXR with cardiomegaly and mild to moderate severity interstitial edema.   Abdominal pelvic CT scan showed the following: 1. 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 Sequelae associated with a large gastric ulcer and/or an underlying neoplastic process cannot be excluded. Correlation with upper endoscopy is recommended. 2. Short segment of intussusception involving the proximal duodenum. This may be transient in nature. 3. Sigmoid diverticulosis. 4. Multiple heterogeneous uterine fibroids. Correlation with nonemergent pelvic ultrasound is recommended. 5. Moderate to marked severity bibasilar scarring and/or atelectasis. 6. Bilateral pleural effusions, right greater than left. 7. Evidence of prior cholecystectomy. 8. Large, lobulated, multi septated, exophytic left renal cyst. Further evaluation with nonemergent renal ultrasound is recommended. 9. Aortic atherosclerosis.       Review of Systems: As mentioned in the history of present illness. All other systems reviewed and are negative. Past Medical History:  Diagnosis Date   CHF (congestive heart failure) (HCC)    COPD (chronic obstructive pulmonary disease) (HCC)    Hyperlipidemia    Hypertension    Mitral regurgitation    Pneumonia    Thyroid disease  Past Surgical History:  Procedure Laterality Date   CHOLECYSTECTOMY     CORONARY ARTERY BYPASS GRAFT     INSERTION OF ICD     Social History:  reports that she has never smoked. She has never used smokeless tobacco. She reports that she does not currently use alcohol. She reports that she does not currently use drugs.  No Known Allergies  No  family history on file.  Prior to Admission medications   Medication Sig Start Date End Date Taking? Authorizing Provider  apixaban (ELIQUIS) 2.5 MG TABS tablet Take 2.5 mg by mouth 2 (two) times daily.   Yes [provider]  atorvastatin (LIPITOR) 20 MG tablet Take 20 mg by mouth daily.   Yes [provider]  calcium carbonate (OSCAL) 1500 (600 Ca) MG TABS tablet Take 600 mg of elemental calcium by mouth at bedtime.   Yes [provider]  CYANOCOBALAMIN PO Take 1 tablet by mouth daily.   Yes [provider]  empagliflozin (JARDIANCE) 10 MG TABS tablet Take 10 mg by mouth daily.   Yes [provider]  ferrous sulfate 325 (65 FE) MG tablet Take 325 mg by mouth daily with breakfast.   Yes [provider]  furosemide (LASIX) 20 MG tablet Take 20 mg by mouth daily. Pt alternates 20mg  one day and 40mg  the next day   Yes [provider]  lisinopril (PRINIVIL,ZESTRIL) 2.5 MG tablet Take 5 mg by mouth daily.   Yes [provider]  methimazole (TAPAZOLE) 10 MG tablet Take 2.5 mg by mouth daily.   Yes [provider]  metoprolol tartrate (LOPRESSOR) 25 MG tablet Take 25 mg by mouth 2 (two) times daily.   Yes [provider]  Multiple Vitamins-Minerals (PRESERVISION AREDS 2 PO) Take 1 tablet by mouth 2 (two) times daily.   Yes [provider]  oxymetazoline (AFRIN) 0.05 % nasal spray Place 1 spray into both nostrils 2 (two) times daily as needed for congestion.   Yes [provider]  pantoprazole (PROTONIX) 40 MG tablet Take 40 mg by mouth 2 (two) times daily before a meal.   Yes [provider]  sennosides-docusate sodium (SENOKOT-S) 8.6-50 MG tablet Take 1 tablet by mouth daily.   Yes [provider]  spironolactone (ALDACTONE) 25 MG tablet Take 12.5 mg by mouth daily.   Yes [provider]  acetaminophen (TYLENOL) 325 MG tablet Take 650 mg by mouth every 4 (four) hours as  needed. Patient not taking: Reported on 04/27/2022    [provider]  amiodarone (PACERONE) 200 MG tablet Take 200 mg by mouth daily. Patient not taking: Reported on 04/27/2022    [provider]  aspirin EC 81 MG tablet Take 81 mg by mouth daily. Patient not taking: Reported on 04/27/2022    [provider]  carvedilol (COREG) 3.125 MG tablet Take 3.125 mg by mouth 2 (two) times daily with a meal. Patient not taking: Reported on 04/27/2022    [provider]  nitroGLYCERIN (NITROSTAT) 0.4 MG SL tablet Place 0.4 mg under the tongue every 5 (five) minutes as needed for chest pain.    [provider]    Physical Exam: Vitals:   04/27/22 0630 04/27/22 0730 04/27/22 0803 04/27/22 0922  BP: (!) 100/52 106/67  109/62  Pulse: 74 82  75  Resp: (!) 24 (!) 24    Temp:   98.6 F (37 C) 97.7 F (36.5 C)  TempSrc:   Oral Oral  SpO2:  100% 100%  96%   General:  Appears very frail/cachectic Eyes:  EOMI, normal lids, iris ENT:  grossly normal hearing, lips & tongue, mmm Neck:  no LAD, masses or thyromegaly Cardiovascular:  RRR, no r/g, [id number] systolic murmur. 1+ LE edema.  Respiratory:   CTA bilaterally with no wheezes/rales/rhonchi.  Normal respiratory effort. Abdomen:  soft, NT, ND Skin:  no rash or induration seen on limited exam Musculoskeletal:  grossly normal tone BUE/BLE, good ROM, no bony abnormality Psychiatric:  blunted mood and affect, speech fluent and appropriate, AOx3 Neurologic:  CN 2-12 grossly intact, moves all extremities in coordinated fashion   Radiological Exams on Admission: Independently reviewed - see discussion in A/P where applicable  DG Chest Portable 1 View  Result Date: 04/26/2022 CLINICAL DATA:  Right lower quadrant pain that radiates into the right leg. EXAM: PORTABLE CHEST 1 VIEW COMPARISON:  None Available. FINDINGS: Radiopaque sternal fixation plates and screws and vascular clips are present. A multi lead AICD is also  seen. There is mild to moderate severity enlargement of the cardiac silhouette with marked severity calcification of the thoracic aorta. Mild to moderate severity diffusely increased interstitial lung markings are seen. Mild atelectasis and/or infiltrate is also present within the mid to lower right lung and left lung base. There is no evidence of a pleural effusion or pneumothorax. Multilevel degenerative changes seen throughout the thoracic spine. IMPRESSION: 1. Cardiomegaly and evidence of prior median sternotomy/CABG with mild to moderate severity interstitial edema. 2. Mild bilateral atelectasis and/or infiltrate. Electronically Signed   By: Virgina Norfolk M.D.   On: 04/26/2022 23:31   CT ABDOMEN PELVIS W CONTRAST  Result Date: 04/26/2022 CLINICAL DATA:  Right lower quadrant pain. EXAM: CT ABDOMEN AND PELVIS WITH CONTRAST TECHNIQUE: Multidetector CT imaging of the abdomen and pelvis was performed using the standard protocol following bolus administration of intravenous contrast. RADIATION DOSE REDUCTION: This exam was performed according to the departmental dose-optimization program which includes automated exposure control, adjustment of the mA and/or kV according to patient size and/or use of iterative reconstruction technique. CONTRAST:  81mL OMNIPAQUE IOHEXOL 300 MG/ML  SOLN COMPARISON:  None Available. FINDINGS: Lower chest: Moderate to marked severity areas of scarring and/or atelectasis are seen within the bilateral lung bases. There is a moderate-sized right-sided pleural effusion. A very small left pleural effusion is also noted. Hepatobiliary: No focal liver abnormality is seen. Status post cholecystectomy. No biliary dilatation. Pancreas: Unremarkable. No pancreatic ductal dilatation or surrounding inflammatory changes. Spleen: Normal in size without focal abnormality. Adrenals/Urinary Tract: The right adrenal gland is unremarkable. There is mild diffuse left adrenal gland enlargement. Kidneys  are normal in size, without renal calculi or hydronephrosis. A 6.2 cm x 6.2 cm x 9.7 cm lobulated, multi septated, exophytic cyst is seen extending from the lower pole of the left kidney. Numerous subcentimeter bilateral simple renal cysts are also seen. The urinary bladder is poorly distended and is otherwise unremarkable. Stomach/Bowel: A markedly thickened hypodense gastric antrum is seen (approximately 59.86 Hounsfield units). A 1.7 cm x 1.6 cm x 1.4 cm fluid-filled area of outpouching is noted along the dorsal aspect of the gastric antrum (axial CT image 31, CT series 2/coronal reformatted image 56, CT series 5). Diffusely dense gastric mucosa is also seen. The appendix is not clearly identified. A short segment of intussuscepted proximal duodenum is noted just beyond the duodenal bulb (best seen on coronal reformatted images 50 through 55, CT series 5). No evidence of bowel dilatation. Noninflamed diverticula are  seen throughout the sigmoid colon. Vascular/Lymphatic: Aortic atherosclerosis. No enlarged abdominal or pelvic lymph nodes. Reproductive: Multiple heterogeneous uterine fibroids are seen. The largest measures approximately 2.7 cm by 2.0 cm x 1.6 cm and is seen within the lateral aspect of the uterus on the left. The bilateral adnexa are unremarkable. Other: No abdominal wall hernia or abnormality. No abdominopelvic ascites. Musculoskeletal: Multilevel degenerative changes are seen throughout the lumbar spine. IMPRESSION: 1. 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 Sequelae associated with a large gastric ulcer and/or an underlying neoplastic process cannot be excluded. Correlation with upper endoscopy is recommended. 2. Short segment of intussusception involving the proximal duodenum. This may be transient in nature. 3. Sigmoid diverticulosis. 4. Multiple heterogeneous uterine fibroids. Correlation with nonemergent pelvic ultrasound is recommended. 5. Moderate to  marked severity bibasilar scarring and/or atelectasis. 6. Bilateral pleural effusions, right greater than left. 7. Evidence of prior cholecystectomy. 8. Large, lobulated, multi septated, exophytic left renal cyst. Further evaluation with nonemergent renal ultrasound is recommended. 9. Aortic atherosclerosis. Aortic Atherosclerosis (ICD10-I70.0). Electronically Signed   By: Virgina Norfolk M.D.   On: 04/26/2022 23:27    EKG: Independently reviewed.  Paced rhythm with rate 78; LLL with no evidence of acute ischemia   Labs on Admission: I have personally reviewed the available labs and imaging studies at the time of the admission.  Pertinent labs:    K+ 3.2 BUN 36/Creatinine 1.47/GFR 34 - stable BNP 1839.9 HS troponin 38, 32 Lactate 1.0, 1.0 WBC 10 COVID/flu/RSV negative   Assessment and Plan: Principal Problem:   Acute on chronic systolic (congestive) heart failure (HCC) Active Problems:   Abdominal pain   Elevated brain natriuretic peptide (BNP) level   Shortness of breath   Counseling regarding goals of care    Abdominal pain -Patient presenting with abdominal pain, anorexia, early satiety -Imaging with reported large gastric diverticulum along the gastric antrum with concern for large gastric ulcer/neoplasm as well as short segment of intussusception -GI consulted -Surgery consulted - no surgical indication at this time and they have signed off -Also reported to have uterine fibroids; non-urgent pelvic US encouraged although given her age will defer for now -She was previously told that she was not a candidate for colonoscopy/EGD by cardiology; will order cards consult for clearance -Will start IV Protonix daily for now  Acute on chronic diastolic CHF with severe MR, h/o CAD -s/p stents -Severe MR on echo in 10/2020 -Patient with known h/o chronic diastolic CHF presenting with chest pain yesterday, resolved -CXR consistent with mild pulmonary edema -Elevated BNP without  known baseline ([facility]BNP at [facility]) -With elevated BNP and abnl CXR, acute decompensated CHF seems probable  -However, she was minimally symptomatic at the time of admission after having received lasix IV x 1 -Cardiology consulted for clearance -Hold Jardiance -Continue spironolactone -Chest pain is not currently thought to have been related to ACS  COPD -Supposed to be on 2L home O2 but she generally chooses not to wear O2 -No meds -She was never a past smoker so this may be an incorrect diagnosis  Afib -Has pacer/AICD -Hold Eliquis given probable need for intervention  HTN -Continue lisinopril, metoprolol -Will also add prn hydralazine  HLD -Continue Lipitor  DM -Hold Jardiance -Will cover with SSI for now  Renal cyst -Incidental finding on imaging -Non-emergent renal US may be ordered at some time  Hyperthyroidism -Continue methimazole  Goals of care -Long discussion with patient and daughters -Despite her frailty and  the high likelihood of poor outcome, she would prefer to be a full code at this time -Given her progressive decline, frailty, and advanced age - she may be a good candidate for palliative care   Advance Care Planning:   Code Status: Full Code   Consults: Cardiology; Surgery; GI; CHF navigator; nutrition; PT/OT  DVT Prophylaxis: SCDs  Family Communication: Daughters were present throughout evaluation  Severity of Illness: The appropriate patient status for this patient is INPATIENT. Inpatient status is judged to be reasonable and necessary in order to provide the required intensity of service to ensure the patient's safety. The patient's presenting symptoms, physical exam findings, and initial radiographic and laboratory data in the context of their chronic comorbidities is felt to place them at high risk for further clinical deterioration. Furthermore, it is not anticipated that the patient will be medically stable for discharge from the hospital within  2 midnights of admission.   * I certify that at the point of admission it is my clinical judgment that the patient will require inpatient hospital care spanning beyond 2 midnights from the point of admission due to high intensity of service, high risk for further deterioration and high frequency of surveillance required.*  Author: Karmen Bongo, MD 04/27/2022 6:47 PM  For on call review [email or web address].

## 2022-04-27 NOTE — ED Notes (Signed)
Pt's cardiac monitor alarmed for a 7 beat run of v-tach. Pt asymptomatic, asleep during this. Alarm rhythm strip saved. Delo MD made aware.

## 2022-04-27 NOTE — ED Notes (Signed)
Pt's SpO2 waas 87-89% on RA. RT came to triage, pt placed on 2L oxygen via nasal cannula, SpO2 increased to 95%

## 2022-04-27 NOTE — ED Notes (Signed)
Report to  [place]

## 2022-04-27 NOTE — Progress Notes (Signed)
Plan of Care Note for accepted transfer   Patient: Jane [name] MRN: [id number]   [place]: 04/26/2022  Facility requesting transfer: [facility]  Requesting Provider: Tegeler, Gwenyth Allegra, MD   Reason for transfer: Acute on chronic systolic CHF. Facility course: 89 year old female with history of chronic systolic and diastolic CHF, coronary artery disease, hypertension, paroxysmal atrial fibrillation on Eliquis and amiodarone, GERD, dyslipidemia, stage III CKD, history of Graves' disease, who presented to with acute onset of worsening dyspnea with associated edema and was noted to have a BNP of 1800.  She has been having severe intermittent abdominal pain and her abdominal CT scan showed evidence of gastric ulceration and possible jejunal intussusception.  Dr. Brantley Stage with general surgery was notified and is aware about the patient.  He did not think it is a surgical problem at this time and felt it was less likely intussusception and more likely possible malignancy.  He felt GI consultation in the morning would be reasonable and the patient may need endoscopy and general surgery team will see the patient in consultation as well.  The patient was started on diuresis with IV Lasix.  She is on her 2 L of O2 by nasal cannula that she takes at home at times..  She was started on a small dose of 20 mg of IV Lasix as her family stated that her blood pressure dropped with Lasix in the past.  Portable chest x-ray showed cardiomegaly with evidence of prior median sternotomy/CABG and mild to moderate severity interstitial edema.  Abdominal pelvic CT scan showed the following: 1. 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 Sequelae associated with a large gastric ulcer and/or an underlying neoplastic process cannot be excluded. Correlation with upper endoscopy is recommended. 2. Short segment of intussusception involving the proximal duodenum. This may be transient in nature. 3. Sigmoid  diverticulosis. 4. Multiple heterogeneous uterine fibroids. Correlation with nonemergent pelvic ultrasound is recommended. 5. Moderate to marked severity bibasilar scarring and/or atelectasis. 6. Bilateral pleural effusions, right greater than left. 7. Evidence of prior cholecystectomy. 8. Large, lobulated, multi septated, exophytic left renal cyst. Further evaluation with nonemergent renal ultrasound is recommended. 9. Aortic atherosclerosis.  The patient was given 20 mg of IV Lasix.  Plan of care: The patient is accepted for admission to Progressive unit, at [facility]..   The patient will be under the care and responsibility of the ER physician until arrival to [facility].  Author: Christel Mormon, MD 04/27/2022  Check [email or web address] for on-call coverage.  Nursing staff, Please call [place] number on Amion as soon as patient's arrival, so appropriate admitting provider can evaluate the pt.

## 2022-04-27 NOTE — ED Notes (Signed)
[facility] called for transport. 

## 2022-04-27 NOTE — Consult Note (Signed)
Cardiology Consultation   [name] ID: Jane [name] MRN: [id number]; DOB: May 30, 1933  Admit date: 04/26/2022 Date of Consult: 04/27/2022  PCP:  System, Provider Not In   [place] Providers Cardiologist:  None        [name] Profile:   Jane [name] is a 89 y.o. female with a hx of CAD s/p MI and CABG/multiple PCI's, HLD, severe mitral valve regurgitation and mild stenosis, paroxysmal afib, ischemic cardiomyopathy (recovered EF) s/p AICD placement, hypothyroidism who is being seen 04/27/2022 for Jane evaluation of EGD anesthesia clearance at Jane request of Dr. Lorin [name].  History of Present Illness:   Jane [name] presented to Jane [name] on 3/17 with symptoms of abdominal pain. [name] is limited as a historian due to some baseline cognitive decline. HPI supplemented by notes and [name]'s daughters who are at bedside. [name] has struggled with poor oral intake in recent weeks though most noticeably in Jane last few days. Family showed me pictures of Jane [name] from just last week at her birthday party and Jane difference in her appearance from then until now is remarkable. It sounds like [name] has had progressive [name] symptoms including abdominal pain, early satiety, belching. She has concurrently complained of feeling short of breath with chest pain though per daughters, it is never clear whether her [name] symptoms are Jane cause of chest pain/dyspnea or not. Per daughters, [name] has always struggled to differentiate. They've given her nitroglycerin several times in recent days when [name] reports dyspnea. She sometimes says that this improves symptoms while other times it is less clear. They also report noticing increased lower extremity edema despite overall weight loss. They have continued with home lasix dosing of Lasix 40/20 mg on alternating days.   Of note, [name] with longstanding hx of both cardiac and [name] issues. A review of OSH records shows that [name] reported lower  abdominal cramping/abdominal fullness to her cardiologist Dr. Alycia [name] in January 2023. Appears that conservative plan was initially tried with low dose milk of magnesia and Colace. [name] saw Jane [name] in March of 2023 with determination to pursue UGIS rather than EGD due to significant cardio-pulmonary co-morbidities. Findings noted: "Small sliding hiatal hernia. There appear to be mild benign strictures in Jane distal esophagus, just proximal to Jane GE junction and also just proximal to Jane vestibular portion of Jane distal esophagus. No evidence of gastric outlet obstruction."  [name] was also seen by Dr. Mina [name] with cardiology in November 2023 where [name] noted to be stable on cardiac regimen. [name] with longstanding severe mitral regurgitation but felt to be high risk for MVR.   Initial ED labs notable for potassium 3.2, creatinine 1.47, albumin 3.4, BNP 1839.9, Troponin 38, 32.  Past Medical History:  Diagnosis Date   CHF (congestive heart failure) (HCC)    COPD (chronic obstructive pulmonary disease) (HCC)    Hyperlipidemia    Hypertension    Mitral regurgitation    Pneumonia    Thyroid disease     Past Surgical History:  Procedure Laterality Date   CHOLECYSTECTOMY     CORONARY ARTERY BYPASS GRAFT     INSERTION OF ICD       Home Medications:  Prior to Admission medications   Medication Sig Start Date End Date Taking? Authorizing Provider  apixaban (ELIQUIS) 2.5 MG TABS tablet Take 2.5 mg by mouth 2 (two) times daily.   Yes [provider]  atorvastatin (LIPITOR) 20 MG tablet Take 20 mg by mouth every evening.  Yes [provider]  calcium carbonate (OSCAL) 1500 (600 Ca) MG TABS tablet Take 600 mg of elemental calcium by mouth at bedtime.   Yes [provider]  CYANOCOBALAMIN PO Take 1 tablet by mouth daily.   Yes [provider]  docusate sodium (COLACE) 50 MG capsule Take 50 mg by mouth every 3 (three) days.   Yes [provider]   empagliflozin (JARDIANCE) 10 MG TABS tablet Take 10 mg by mouth daily.   Yes [provider]  ferrous sulfate 325 (65 FE) MG tablet Take 325 mg by mouth daily with breakfast.   Yes [provider]  furosemide (LASIX) 20 MG tablet Take 20-40 mg by mouth daily. Pt alternates 20mg  one day and 40mg  Jane next day   Yes [provider]  lisinopril (PRINIVIL,ZESTRIL) 2.5 MG tablet Take 5 mg by mouth daily.   Yes [provider]  methimazole (TAPAZOLE) 5 MG tablet Take 2.5 mg by mouth daily. 06/12/20  Yes [provider]  metoprolol tartrate (LOPRESSOR) 25 MG tablet Take 25 mg by mouth 2 (two) times daily.   Yes [provider]  Multiple Vitamins-Minerals (PRESERVISION AREDS 2 PO) Take 1 tablet by mouth 2 (two) times daily.   Yes [provider]  nitroGLYCERIN (NITROSTAT) 0.4 MG SL tablet Place 0.4 mg under Jane tongue every 5 (five) minutes as needed for chest pain.   Yes [provider]  oxymetazoline (AFRIN) 0.05 % nasal spray Place 1 spray into both nostrils 2 (two) times daily as needed for congestion.   Yes [provider]  pantoprazole (PROTONIX) 40 MG tablet Take 40 mg by mouth 2 (two) times daily before a meal.   Yes [provider]  spironolactone (ALDACTONE) 25 MG tablet Take 12.5 mg by mouth daily.   Yes [provider]    Inpatient Medications: Scheduled Meds:  [START ON 04/28/2022] atorvastatin  20 mg Oral QHS   docusate sodium  100 mg Oral BID   furosemide  20 mg Oral Daily   lisinopril  5 mg Oral Daily   methimazole  2.5 mg Oral Daily   metoprolol tartrate  25 mg Oral BID   pantoprazole (PROTONIX) IV  40 mg Intravenous Daily   potassium chloride  40 mEq Oral Once   senna-docusate  1 tablet Oral Daily   sodium chloride flush  3 mL Intravenous Q12H   spironolactone  12.5 mg Oral Daily   Continuous Infusions:  lactated ringers 75 mL/hr at 04/27/22 1143   PRN Meds: acetaminophen **OR**  acetaminophen, bisacodyl, hydrALAZINE, morphine injection, ondansetron **OR** ondansetron (ZOFRAN) IV, polyethylene glycol  Allergies:   No Known Allergies  Social History:   Social History   Socioeconomic History   Marital status: Widowed    Spouse name: Not on file   Number of children: Not on file   Years of education: Not on file   Highest education level: Not on file  Occupational History   Not on file  Tobacco Use   Smoking status: Never   Smokeless tobacco: Never  Substance and Sexual Activity   Alcohol use: Not Currently   Drug use: Not Currently   Sexual activity: Not on file  Other Topics Concern   Not on file  Social History Narrative   Not on file   Social Determinants of Health   Financial Resource Strain: Not on file  Food Insecurity: Not on file  Transportation Needs: Not on file  Physical Activity: Not on file  Stress: Not on file  Social Connections: Not on file  Intimate Partner Violence: Not on file    Family History:   No family history on file.   ROS:  Please see Jane history of present illness.   All other ROS reviewed and negative.     Physical Exam/Data:   Vitals:   04/27/22 0630 04/27/22 0730 04/27/22 0803 04/27/22 0922  BP: (!) 100/52 106/67  109/62  Pulse: 74 82  75  Resp: (!) 24 (!) 24    Temp:   98.6 F (37 C) 97.7 F (36.5 C)  TempSrc:   Oral Oral  SpO2: 100% 100%  96%    Intake/Output Summary (Last 24 hours) at 04/27/2022 1436 Last data filed at 04/27/2022 1246 Gross per 24 hour  Intake --  Output 500 ml  Net -500 ml      12/31/2020   10:54 PM 02/13/2015   11:24 AM 02/05/2015    3:15 PM  Last 3 Weights  Weight (lbs) 97 lb 107 lb 114 lb 9.6 oz  Weight (kg) 43.999 kg 48.535 kg 51.982 kg     There is no height or weight on file to calculate BMI.  General:  Frail and malnourished appearing  HEENT: normal Neck: JVP 3-4cm above clavicle with HOB at 30 degrees Vascular: No carotid bruits; Distal pulses 2+  bilaterally Cardiac:  normal S1, S2; RRR; loud holosystolic murmur Lungs:  bilateral crackles with diminished lower lobes, R>L Abd: soft, nontender, no hepatomegaly  Ext: no edema Musculoskeletal:  No deformities, BUE and BLE strength normal and equal Skin: warm and dry  Neuro:  CNs 2-12 intact, no focal abnormalities noted Psych:  Normal affect   EKG:  Jane EKG was personally reviewed and demonstrates:  AV paced rhythm Telemetry:  Telemetry was personally reviewed and demonstrates:  AV paced rhythm  Relevant CV Studies:  Last TTE 10/31/20 with Jane: DOPPLER ECHO and OTHER SPECIAL PROCEDURES ------------------------------------ Aortic: No AR TRIVIAL AS 2.2 m/s peak vel 19 mmHg peak grad 11 mmHg mean grad 0.7 cm2 by DOPPLER LVOT Diam: 1.7 cm. Resting LVOT Vel: 0.6 m/s. Dimensionless Index: 0.31  Mitral: SEVERE MR MILD MS 2.0 m/s peak vel 13 mmHg peak grad 4 mmHg mean grad MV Inflow E Vel.= 197.1 cm/s MV Annulus E'Vel.= 5.0 cm/s E/E'Ratio= 39  Tricuspid: MODERATE TR No TS 3.9 m/s peak TR vel 65 mmHg peak RV pressure  Pulmonary: TRIVIAL PR No PS   Laboratory Data:  High Sensitivity Troponin:   Recent Labs  Lab 04/26/22 2241 04/27/22 0202  TROPONINIHS 38* 32*     Chemistry Recent Labs  Lab 04/26/22 2122  NA 141  K 3.2*  CL 108  CO2 23  GLUCOSE 118*  BUN 36*  CREATININE 1.47*  CALCIUM 9.1  GFRNONAA 34*  ANIONGAP 10    Recent Labs  Lab 04/26/22 2122  PROT 7.1  ALBUMIN 3.4*  AST 16  ALT 15  ALKPHOS 65  BILITOT 1.1   Lipids No results for input(s): "CHOL", "TRIG", "HDL", "LABVLDL", "LDLCALC", "CHOLHDL" in Jane last 168 hours.  Hematology Recent Labs  Lab 04/26/22 2122  WBC 10.0  RBC 4.24  HGB 13.7  HCT 44.4  MCV 104.7*  MCH 32.3  MCHC 30.9  RDW 16.1*  PLT 185   Thyroid No results for input(s): "TSH", "FREET4" in Jane last 168 hours.  BNP Recent Labs  Lab 04/26/22 2241  BNP 1,839.9*    DDimer No results for input(s): "DDIMER" in Jane last 168  hours.   Radiology/Studies:  DG Chest Portable 1 View  Result Date: 04/26/2022 CLINICAL DATA:  Right lower quadrant pain that radiates into Jane right leg. EXAM: PORTABLE CHEST 1 VIEW COMPARISON:  None Available. FINDINGS: Radiopaque sternal fixation plates and screws and vascular clips are present. A multi lead AICD is also seen. There is mild to moderate severity enlargement of Jane cardiac silhouette with marked severity calcification of Jane thoracic aorta. Mild to moderate severity diffusely increased interstitial lung markings are seen. Mild atelectasis and/or infiltrate is also present within Jane mid to lower right lung and left lung base. There is no evidence of a pleural effusion or pneumothorax. Multilevel degenerative changes seen throughout Jane thoracic spine. IMPRESSION: 1. Cardiomegaly and evidence of prior median sternotomy/CABG with mild to moderate severity interstitial edema. 2. Mild bilateral atelectasis and/or infiltrate. Electronically Signed   By: Virgina Norfolk M.D.   On: 04/26/2022 23:31   CT ABDOMEN PELVIS W CONTRAST  Result Date: 04/26/2022 CLINICAL DATA:  Right lower quadrant pain. EXAM: CT ABDOMEN AND PELVIS WITH CONTRAST TECHNIQUE: Multidetector CT imaging of Jane abdomen and pelvis was performed using Jane standard protocol following bolus administration of intravenous contrast. RADIATION DOSE REDUCTION: This exam was performed according to Jane departmental dose-optimization program which includes automated exposure control, adjustment of the mA and/or kV according to [name] size and/or use of iterative reconstruction technique. CONTRAST:  62mL OMNIPAQUE IOHEXOL 300 MG/ML  SOLN COMPARISON:  None Available. FINDINGS: Lower chest: Moderate to marked severity areas of scarring and/or atelectasis are seen within Jane bilateral lung bases. There is a moderate-sized right-sided pleural effusion. A very small left pleural effusion is also noted. Hepatobiliary: No focal liver  abnormality is seen. Status post cholecystectomy. No biliary dilatation. Pancreas: Unremarkable. No pancreatic ductal dilatation or surrounding inflammatory changes. Spleen: Normal in size without focal abnormality. Adrenals/Urinary Tract: Jane right adrenal gland is unremarkable. There is mild diffuse left adrenal gland enlargement. Kidneys are normal in size, without renal calculi or hydronephrosis. A 6.2 cm x 6.2 cm x 9.7 cm lobulated, multi septated, exophytic cyst is seen extending from Jane lower pole of Jane left kidney. Numerous subcentimeter bilateral simple renal cysts are also seen. Jane urinary bladder is poorly distended and is otherwise unremarkable. Stomach/Bowel: A markedly thickened hypodense gastric antrum is seen (approximately 59.86 Hounsfield units). A 1.7 cm x 1.6 cm x 1.4 cm fluid-filled area of outpouching is noted along Jane dorsal aspect of Jane gastric antrum (axial CT image 31, CT series 2/coronal reformatted image 56, CT series 5). Diffusely dense gastric mucosa is also seen. Jane appendix is not clearly identified. A short segment of intussuscepted proximal duodenum is noted just beyond Jane duodenal bulb (best seen on coronal reformatted images 50 through 55, CT series 5). No evidence of bowel dilatation. Noninflamed diverticula are seen throughout Jane sigmoid colon. Vascular/Lymphatic: Aortic atherosclerosis. No enlarged abdominal or pelvic lymph nodes. Reproductive: Multiple heterogeneous uterine fibroids are seen. Jane largest measures approximately 2.7 cm by 2.0 cm x 1.6 cm and is seen within Jane lateral aspect of Jane uterus on Jane left. Jane bilateral adnexa are unremarkable. Other: No abdominal wall hernia or abnormality. No abdominopelvic ascites. Musculoskeletal: Multilevel degenerative changes are seen throughout Jane lumbar spine. IMPRESSION: 1. Large gastric diverticulum along Jane dorsal aspect of Jane gastric antrum. Sequelae associated with a large gastric ulcer and/or an underlying  neoplastic process cannot be excluded. Correlation with upper endoscopy is recommended. 2. Short segment of intussusception involving Jane proximal duodenum. This may  be transient in nature. 3. Sigmoid diverticulosis. 4. Multiple heterogeneous uterine fibroids. Correlation with nonemergent pelvic ultrasound is recommended. 5. Moderate to marked severity bibasilar scarring and/or atelectasis. 6. Bilateral pleural effusions, right greater than left. 7. Evidence of prior cholecystectomy. 8. Large, lobulated, multi septated, exophytic left renal cyst. Further evaluation with nonemergent renal ultrasound is recommended. 9. Aortic atherosclerosis. Aortic Atherosclerosis (ICD10-I70.0). Electronically Signed   By: Virgina Norfolk M.D.   On: 04/26/2022 23:27     Assessment and Plan:  Kiajah Gosse is a 89 y.o. female with a hx of CAD s/p MI and CABG/multiple PCI's, HLD, severe mitral valve regurgitation and mild stenosis, paroxysmal afib, ischemic cardiomyopathy (recovered EF) s/p AICD placement, hypothyroidism who is being seen 04/27/2022 for Jane evaluation of EGD anesthesia clearance at Jane request of Dr. Lorin [name].  Acute on chronic HFpEF (LVEF recovered) S/P AICD  [name] admitted with symptoms of significantly increased oral intake, malnutrition, weakness, and dyspnea. CT abd/pelvis noted moderate right pleural effusion, small left pleural effusion. CXR with cardiomegaly, mild to moderate interstitial edema. BNP 1839.9.  I suspect that [name]'s symptoms have been driven by malnutrition. Perhaps worsening HF in Jane setting of limited oral intake as a result of [name] symptoms. Would continue cautious lasix administration given pulmonary crackles/pleural effusion though frailty and malnourishment will limit ability to tolerate fluid loss through diuresis.  Recheck albumin which was initially found mildly low. Likely to continue decreasing/contributing to  Continue GDMT including Spironolactone, Metoprolol, Jardiance,  Lisinopril Push protein/calorie dense meals and supplement with Ensure protein+  Support palliative care involvement.   CAD s/p PCI's and CABG  [name] with vague symptoms of chest pain in Jane setting of [name] discomfort/malnutrition/early satiety. Troponin mildly elevated and flat 38->32.   No indication of ACS at this time. Continue metoprolol and atorvastatin  Mitral valve regurgitation  Per 2022 TTE: 2.0 m/s peak vel 13 mmHg peak grad 4 mmHg mean grad MV Inflow E Vel.= 197.1 cm/s MV Annulus E'Vel.= 5.0 cm/s E/E'Ratio= 39.  [name] is not a candidate for MVR. Continue HF GDMT as above.  Afib  [name] AV paced on telemetry. Continue Eliquis  Surgical/EGD risk  Cardiology consulted for EGD clearance though per [name] notes, there are no current plans to perform. Given multiple co-morbidities, [name] would be high risk for any procedure involving sedation and there are no recommended cardiac interventions at this time to modify risks. RCRI 6.6%. Functional METS 2.96.   Per primary team:  Abdominal pain Hypothyroidism   Risk Assessment/Risk Scores:        New York Heart Association (NYHA) Functional Class NYHA Class III  CHA2DS2-VASc Score = 6   This indicates a 9.7% annual risk of stroke. Jane [name]'s score is based upon: CHF History: 1 HTN History: 1 Diabetes History: 0 Stroke History: 0 Vascular Disease History: 1 Age Score: 2 Gender Score: 1         For questions or updates, please contact [place] Please consult [email or web address] for contact info under    Signed, Lily Kocher, PA-C  04/27/2022 2:36 PM

## 2022-04-27 NOTE — Consult Note (Addendum)
Jane [name] 05/03/1933  [id number].    Requesting MD: Lorin Mercy, MD Chief Complaint/Reason for Consult: gastric diverticulum vs malignancy, possible intussusception of proximal duodenum  HPI:  Jane [name] is an 89 y/o F with CAD and CABG (2016), CHF, afib on Eliquis (last dose today), ICD placement, CKD III, GERD and chronic respiratory failure on 2L O2 at home who presents with abdominal pain. She states the pain started about 3 days ago and has gradually gotten worse. Described as sharp, cramping, and worse over her upper abdomen. Denies aggravating or alleviating factors. Associated symptoms include decreased appetite, nausea, belching and 1 small episode of non-bloody emesis a few days ago. Last BM yesterday. She also reports about a 20lb weight loss over the last year. She reports similar pain about one year ago and underwent UGI with SB follow through at [facility] where a small hiatal hernia was noted, along with mild esophageal strictures, no evidence of GOO. Given her significant heart disease she was felt to not be a candidate for endoscopic evaluation and was advised to stay on a daily PPI and conitnue antacids as needed.  Past abdominal surgeries: cholecystectomy  Substance use: denies tobacco, EtOH, or drug use Other social history: lives with daughter. Ambulates without regular use of an assistive device.   ROS: As above Review of Systems  All other systems reviewed and are negative.   No family history on file.  Past Medical History:  Diagnosis Date   CHF (congestive heart failure) (HCC)    COPD (chronic obstructive pulmonary disease) (HCC)    Hyperlipidemia    Hypertension    Mitral regurgitation    Pneumonia    Thyroid disease     Past Surgical History:  Procedure Laterality Date   CHOLECYSTECTOMY     CORONARY ARTERY BYPASS GRAFT     INSERTION OF ICD      Social History:  reports that she has never smoked. She has never used smokeless tobacco. She reports that  she does not currently use alcohol. She reports that she does not currently use drugs.  Allergies: No Known Allergies  Medications Prior to Admission  Medication Sig Dispense Refill   apixaban (ELIQUIS) 2.5 MG TABS tablet Take 2.5 mg by mouth 2 (two) times daily.     atorvastatin (LIPITOR) 20 MG tablet Take 20 mg by mouth every evening.     calcium carbonate (OSCAL) 1500 (600 Ca) MG TABS tablet Take 600 mg of elemental calcium by mouth at bedtime.     CYANOCOBALAMIN PO Take 1 tablet by mouth daily.     docusate sodium (COLACE) 50 MG capsule Take 50 mg by mouth every 3 (three) days.     empagliflozin (JARDIANCE) 10 MG TABS tablet Take 10 mg by mouth daily.     ferrous sulfate 325 (65 FE) MG tablet Take 325 mg by mouth daily with breakfast.     furosemide (LASIX) 20 MG tablet Take 20-40 mg by mouth daily. Pt alternates 20mg  one day and 40mg  the next day     lisinopril (PRINIVIL,ZESTRIL) 2.5 MG tablet Take 5 mg by mouth daily.     methimazole (TAPAZOLE) 5 MG tablet Take 2.5 mg by mouth daily.     metoprolol tartrate (LOPRESSOR) 25 MG tablet Take 25 mg by mouth 2 (two) times daily.     Multiple Vitamins-Minerals (PRESERVISION AREDS 2 PO) Take 1 tablet by mouth 2 (two) times daily.     nitroGLYCERIN (NITROSTAT) 0.4 MG SL tablet Place 0.4  mg under the tongue every 5 (five) minutes as needed for chest pain.     oxymetazoline (AFRIN) 0.05 % nasal spray Place 1 spray into both nostrils 2 (two) times daily as needed for congestion.     pantoprazole (PROTONIX) 40 MG tablet Take 40 mg by mouth 2 (two) times daily before a meal.     spironolactone (ALDACTONE) 25 MG tablet Take 12.5 mg by mouth daily.       Physical Exam: Blood pressure 109/62, pulse 75, temperature 97.7 F (36.5 C), temperature source Oral, resp. rate (!) 24, SpO2 96 %. General: Pleasant chronically ill appearing white female in NAD HEENT: head -normocephalic, atraumatic; Eyes: Pupils equal and round, anicteric sclerae Neck- Trachea  is midline, no stridor CV-  irregular, no m/r/g, mild lower extremity edema  Pulm- breathing is non-labored on nasal cannula Abd- soft, NT/ND, appropriate bowel sounds in 4 quadrants, no masses, hernias, or organomegaly. GU- deferred  Neuro- non-focal exam, gait not assessed Psych- Alert and Oriented x3 with appropriate affect but does appear to have some cognitive deficit  Skin: warm and dry, no rashes or lesions   Results for orders placed or performed during the hospital encounter of 04/26/22 (from the past 48 hour(s))  Lipase, blood     Status: None   Collection Time: 04/26/22  9:22 PM  Result Value Ref Range   Lipase 36 11 - 51 U/L    Comment: Performed at [facility], [address] [place], [place] [address]  Comprehensive metabolic panel     Status: Abnormal   Collection Time: 04/26/22  9:22 PM  Result Value Ref Range   Sodium 141 135 - 145 mmol/L   Potassium 3.2 (L) 3.5 - 5.1 mmol/L   Chloride 108 98 - 111 mmol/L   CO2 23 22 - 32 mmol/L   Glucose, Bld 118 (H) 70 - 99 mg/dL    Comment: Glucose reference range applies only to samples taken after fasting for at least 8 hours.   BUN 36 (H) 8 - 23 mg/dL   Creatinine, Ser 1.47 (H) 0.44 - 1.00 mg/dL   Calcium 9.1 8.9 - 10.3 mg/dL   Total Protein 7.1 6.5 - 8.1 g/dL   Albumin 3.4 (L) 3.5 - 5.0 g/dL   AST 16 15 - 41 U/L   ALT 15 0 - 44 U/L   Alkaline Phosphatase 65 38 - 126 U/L   Total Bilirubin 1.1 0.3 - 1.2 mg/dL   GFR, Estimated 34 (L) >60 mL/min    Comment: (NOTE) Calculated using the CKD-EPI Creatinine Equation (2021)    Anion gap 10 5 - 15    Comment: Performed at [facility], [address] [place], [place] [address]  CBC     Status: Abnormal   Collection Time: 04/26/22  9:22 PM  Result Value Ref Range   WBC 10.0 4.0 - 10.5 K/uL   RBC 4.24 3.87 - 5.11 MIL/uL   Hemoglobin 13.7 12.0 - 15.0 g/dL   HCT 44.4 36.0 - 46.0 %   MCV 104.7 (H) 80.0 - 100.0 fL   MCH 32.3 26.0 - 34.0 pg   MCHC 30.9  30.0 - 36.0 g/dL   RDW 16.1 (H) 11.5 - 15.5 %   Platelets 185 150 - 400 K/uL   nRBC 0.4 (H) 0.0 - 0.2 %    Comment: Performed at [facility], [address] [place], [place] [address]  Brain natriuretic peptide  Status: Abnormal   Collection Time: 04/26/22 10:41 PM  Result Value Ref Range   B Natriuretic Peptide 1,839.9 (H) 0.0 - 100.0 pg/mL    Comment: Performed at [facility], [address] [place], [place] [address]  Troponin I (High Sensitivity)     Status: Abnormal   Collection Time: 04/26/22 10:41 PM  Result Value Ref Range   Troponin I (High Sensitivity) 38 (H) <18 ng/L    Comment: (NOTE) Elevated high sensitivity troponin I (hsTnI) values and significant  changes across serial measurements may suggest ACS but many other  chronic and acute conditions are known to elevate hsTnI results.  Refer to the "Links" section for chest pain algorithms and additional  guidance. Performed at [facility], [address] [place], [place] [address]   Lactic acid, plasma     Status: None   Collection Time: 04/26/22 10:42 PM  Result Value Ref Range   Lactic Acid, Venous 1.0 0.5 - 1.9 mmol/L    Comment: Performed at [facility], [address] [place], [place] [address]  Resp panel by RT-PCR (RSV, Flu A&B, Covid) Anterior Nasal Swab     Status: None   Collection Time: 04/26/22 10:42 PM   Specimen: Anterior Nasal Swab  Result Value Ref Range   SARS Coronavirus 2 by RT PCR NEGATIVE NEGATIVE    Comment: (NOTE) SARS-CoV-2 target nucleic acids are NOT DETECTED.  The SARS-CoV-2 RNA is generally detectable in upper respiratory specimens during the acute phase of infection. The lowest concentration of SARS-CoV-2 viral copies this assay can detect is 138 copies/mL. A negative result does not preclude SARS-Cov-2 infection and should not be used as the sole basis for treatment or other patient management decisions. A negative result may occur  with  improper specimen collection/handling, submission of specimen other than nasopharyngeal swab, presence of viral mutation(s) within the areas targeted by this assay, and inadequate number of viral copies(<138 copies/mL). A negative result must be combined with clinical observations, patient history, and epidemiological information. The expected result is Negative.  Fact Sheet for Patients:  EntrepreneurPulse.com.au  Fact Sheet for Healthcare Providers:  IncredibleEmployment.be  This test is no t yet approved or cleared by the [place] FDA and  has been authorized for detection and/or diagnosis of SARS-CoV-2 by FDA under an Emergency Use Authorization (EUA). This EUA will remain  in effect (meaning this test can be used) for the duration of the COVID-19 declaration under Section 564(b)(1) of the Act, 21 U.S.C.section 360bbb-3(b)(1), unless the authorization is terminated  or revoked sooner.       Influenza A by PCR NEGATIVE NEGATIVE   Influenza B by PCR NEGATIVE NEGATIVE    Comment: (NOTE) The Xpert Xpress SARS-CoV-2/FLU/RSV plus assay is intended as an aid in the diagnosis of influenza from Nasopharyngeal swab specimens and should not be used as a sole basis for treatment. Nasal washings and aspirates are unacceptable for Xpert Xpress SARS-CoV-2/FLU/RSV testing.  Fact Sheet for Patients: EntrepreneurPulse.com.au  Fact Sheet for Healthcare Providers: IncredibleEmployment.be  This test is not yet approved or cleared by the [place] FDA and has been authorized for detection and/or diagnosis of SARS-CoV-2 by FDA under an Emergency Use Authorization (EUA). This EUA will remain in effect (meaning this test can be used) for the duration of the COVID-19 declaration under Section 564(b)(1) of the Act, 21 U.S.C. section 360bbb-3(b)(1), unless the authorization is terminated or revoked.     Resp  Syncytial Virus by PCR NEGATIVE NEGATIVE    Comment: (NOTE) Fact Sheet for Patients: EntrepreneurPulse.com.au  Fact Sheet for Healthcare Providers: IncredibleEmployment.be  This test is not yet approved or cleared by the [place] FDA and has been authorized for detection and/or diagnosis of SARS-CoV-2 by FDA under an Emergency Use Authorization (EUA). This EUA will remain in effect (meaning this test can be used) for the duration of the COVID-19 declaration under Section 564(b)(1) of the Act, 21 U.S.C. section 360bbb-3(b)(1), unless the authorization is terminated or revoked.  Performed at [facility], [address] [place], [place] [address]   Lactic acid, plasma     Status: None   Collection Time: 04/27/22 12:43 AM  Result Value Ref Range   Lactic Acid, Venous 1.0 0.5 - 1.9 mmol/L    Comment: Performed at [facility], [address] [place], [place] [address]  Troponin I (High Sensitivity)     Status: Abnormal   Collection Time: 04/27/22  2:02 AM  Result Value Ref Range   Troponin I (High Sensitivity) 32 (H) <18 ng/L    Comment: (NOTE) Elevated high sensitivity troponin I (hsTnI) values and significant  changes across serial measurements may suggest ACS but many other  chronic and acute conditions are known to elevate hsTnI results.  Refer to the "Links" section for chest pain algorithms and additional  guidance. Performed at [facility], [address] [place], [place] [address]   Urinalysis, Routine w reflex microscopic -Urine, Clean Catch     Status: Abnormal   Collection Time: 04/27/22  5:12 AM  Result Value Ref Range   Color, Urine YELLOW YELLOW   APPearance CLEAR CLEAR   Specific Gravity, Urine 1.010 1.005 - 1.030   pH 5.0 5.0 - 8.0   Glucose, UA NEGATIVE NEGATIVE mg/dL   Hgb urine dipstick NEGATIVE NEGATIVE   Bilirubin Urine NEGATIVE NEGATIVE   Ketones, ur NEGATIVE NEGATIVE mg/dL    Protein, ur NEGATIVE NEGATIVE mg/dL   Nitrite NEGATIVE NEGATIVE   Leukocytes,Ua TRACE (A) NEGATIVE    Comment: Performed at [facility], [address] [place], [place] [address]  Urinalysis, Microscopic (reflex)     Status: Abnormal   Collection Time: 04/27/22  5:12 AM  Result Value Ref Range   RBC / HPF 0-5 0 - 5 RBC/hpf   WBC, UA 6-10 0 - 5 WBC/hpf   Bacteria, UA FEW (A) NONE SEEN   Squamous Epithelial / HPF 0-5 0 - 5 /HPF    Comment: Performed at [facility], [address] [place], [place] [address]   DG Chest Portable 1 View  Result Date: 04/26/2022 CLINICAL DATA:  Right lower quadrant pain that radiates into the right leg. EXAM: PORTABLE CHEST 1 VIEW COMPARISON:  None Available. FINDINGS: Radiopaque sternal fixation plates and screws and vascular clips are present. A multi lead AICD is also seen. There is mild to moderate severity enlargement of the cardiac silhouette with marked severity calcification of the thoracic aorta. Mild to moderate severity diffusely increased interstitial lung markings are seen. Mild atelectasis and/or infiltrate is also present within the mid to lower right lung and left lung base. There is no evidence of a pleural effusion or pneumothorax. Multilevel degenerative changes seen throughout the thoracic spine. IMPRESSION: 1. Cardiomegaly and evidence of prior median sternotomy/CABG with mild to moderate severity interstitial edema. 2. Mild bilateral atelectasis and/or infiltrate. Electronically Signed   By: Hoover Browns  Houston M.D.   On: 04/26/2022 23:31   CT ABDOMEN PELVIS W CONTRAST  Result Date: 04/26/2022 CLINICAL DATA:  Right lower quadrant pain. EXAM: CT ABDOMEN AND PELVIS WITH CONTRAST TECHNIQUE: Multidetector CT imaging of the abdomen and pelvis was performed using the standard protocol following bolus administration of intravenous contrast. RADIATION DOSE REDUCTION: This exam was performed according to the departmental  dose-optimization program which includes automated exposure control, adjustment of the mA and/or kV according to patient size and/or use of iterative reconstruction technique. CONTRAST:  48mL OMNIPAQUE IOHEXOL 300 MG/ML  SOLN COMPARISON:  None Available. FINDINGS: Lower chest: Moderate to marked severity areas of scarring and/or atelectasis are seen within the bilateral lung bases. There is a moderate-sized right-sided pleural effusion. A very small left pleural effusion is also noted. Hepatobiliary: No focal liver abnormality is seen. Status post cholecystectomy. No biliary dilatation. Pancreas: Unremarkable. No pancreatic ductal dilatation or surrounding inflammatory changes. Spleen: Normal in size without focal abnormality. Adrenals/Urinary Tract: The right adrenal gland is unremarkable. There is mild diffuse left adrenal gland enlargement. Kidneys are normal in size, without renal calculi or hydronephrosis. A 6.2 cm x 6.2 cm x 9.7 cm lobulated, multi septated, exophytic cyst is seen extending from the lower pole of the left kidney. Numerous subcentimeter bilateral simple renal cysts are also seen. The urinary bladder is poorly distended and is otherwise unremarkable. Stomach/Bowel: A markedly thickened hypodense gastric antrum is seen (approximately 59.86 Hounsfield units). A 1.7 cm x 1.6 cm x 1.4 cm fluid-filled area of outpouching is noted along the dorsal aspect of the gastric antrum (axial CT image 31, CT series 2/coronal reformatted image 56, CT series 5). Diffusely dense gastric mucosa is also seen. The appendix is not clearly identified. A short segment of intussuscepted proximal duodenum is noted just beyond the duodenal bulb (best seen on coronal reformatted images 50 through 55, CT series 5). No evidence of bowel dilatation. Noninflamed diverticula are seen throughout the sigmoid colon. Vascular/Lymphatic: Aortic atherosclerosis. No enlarged abdominal or pelvic lymph nodes. Reproductive: Multiple  heterogeneous uterine fibroids are seen. The largest measures approximately 2.7 cm by 2.0 cm x 1.6 cm and is seen within the lateral aspect of the uterus on the left. The bilateral adnexa are unremarkable. Other: No abdominal wall hernia or abnormality. No abdominopelvic ascites. Musculoskeletal: Multilevel degenerative changes are seen throughout the lumbar spine. IMPRESSION: 1. 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 Sequelae associated with a large gastric ulcer and/or an underlying neoplastic process cannot be excluded. Correlation with upper endoscopy is recommended. 2. Short segment of intussusception involving the proximal duodenum. This may be transient in nature. 3. Sigmoid diverticulosis. 4. Multiple heterogeneous uterine fibroids. Correlation with nonemergent pelvic ultrasound is recommended. 5. Moderate to marked severity bibasilar scarring and/or atelectasis. 6. Bilateral pleural effusions, right greater than left. 7. Evidence of prior cholecystectomy. 8. Large, lobulated, multi septated, exophytic left renal cyst. Further evaluation with nonemergent renal ultrasound is recommended. 9. Aortic atherosclerosis. Aortic Atherosclerosis (ICD10-I70.0). Electronically Signed   By: Virgina Norfolk M.D.   On: 04/26/2022 23:27      Assessment/Plan Acute onset abdominal pain Gastric diverticulum and abnormal thickening of the gastric antrum on CT abdomen/pelvis  - afebrile, VSS, WBC 10.0  - currently no evidence of gastric outlet obstruction  - no emergent general surgery needs. Agree with cardiology evaluation for peri-procedural cardiac evaluation and GI consult for consideration of upper endoscopy. I would also recommend palliative care consult for [place]. Given significant cardiac  history it sounds like she may not be a surgical candidate, or would be extremely high risk.    FEN - NPO, IVF VTE - SCD's, hold eliquis if any intervention is going to be pursued, ok for  heparin gtt or DVT ppx ID - none currently  Admit - TRH service  HTN HLD COPD on 2L home O2 CAD, PMH CABG and cardiac stents CHF Afib on Eliquis, history of AICD placement  GERD    I reviewed nursing notes, Consultant GI/cardiology notes, hospitalist notes, last 24 h vitals and pain scores, last 48 h intake and output, last 24 h labs and trends, and last 24 h imaging results.  Wellington [place], [facility] 04/27/2022, 11:25 AM Please see Amion for pager number during day hours 7:00am-4:30pm or 7:00am -11:30am on weekends

## 2022-04-27 NOTE — ED Notes (Signed)
RN gave report to [facility], estimated transport time of 0800.

## 2022-04-27 NOTE — Evaluation (Signed)
Occupational Therapy Evaluation Patient Details Name: Jane [name] MRN: [id number] DOB: 09-09-1933 Today's Date: 04/27/2022   History of Present Illness Patient is a 89 y/o female who presents on 3/17 with RLQ pain and worsening dyspnea with associated edema. Found to have acute on chronic CHF.  Abdominal CT scan-evidence of gastric ulceration and possible jejunal intussusception however MD believes it is malignancy, awaiting GI consult. PMHincludes COPD, CHF, HTN, mitral regurgitation.   Clinical Impression   Pt admitted for above diagnosis. Pt lives with daughter and daughter's husband in a 2 level home that has 16 stairs to the second level and 2 steps outside of the home. 3 weeks PTA the Pt's family reports that she was independent in [place], ambulating short distances no AD in the home but using transport chairs for longer community distances. Pt's daughter reports noticing "minor cognitive decline" recently. During OT session Pt completed bed mobility with Mod I but needed Min A to complete sit>stands and functional ambulation in the hallway. Pt's balance was unsteady, she also could not recall the year unless cued by OT. Patient displayed poor balance when walking without AD and would sway in the open hallway. Pt would benefit from continued skilled acute OT services to address above deficits and help transition to next level of care. Pt would be appropriate for Home OT services with OT/PT recommended DME and 24/7 support/assistance for post acute services. If patient family were not able to provide support, at this current progression the patient would benefit more from a skilled facility post acute with 24/7 assistance and 3+ hours of therapy.    Recommendations for follow up therapy are one component of a multi-disciplinary discharge planning process, led by the attending physician.  Recommendations may be updated based on patient status, additional functional criteria and insurance  authorization.   Follow Up Recommendations  Home health OT     Assistance Recommended at Discharge Frequent or constant Supervision/Assistance  Patient can return home with the following Direct supervision/assist for medications management;Direct supervision/assist for financial management;Assist for transportation;Assistance with cooking/housework;A little help with walking and/or transfers;A little help with bathing/dressing/bathroom    Functional Status Assessment  Patient has had a recent decline in their functional status and demonstrates the ability to make significant improvements in function in a reasonable and predictable amount of time.  Equipment Recommendations  BSC/3in1;Tub/shower seat    Recommendations for Other Services       Precautions / Restrictions Precautions Precautions: Fall Restrictions Weight Bearing Restrictions: No      Mobility Bed Mobility Overal bed mobility: Modified Independent             General bed mobility comments: Pt supine>sit EOG Mod I with increased time and using rails    Transfers Overall transfer level: Needs assistance   Transfers: Sit to/from Stand Sit to Stand: Min assist                  Balance Overall balance assessment: Needs assistance Sitting-balance support: Feet supported, No upper extremity supported Sitting balance-Leahy Scale: Good     Standing balance support: During functional activity, No upper extremity supported Standing balance-Leahy Scale: Poor Standing balance comment: MIn A                           ADL either performed or assessed with clinical judgement   ADL Overall ADL's : Needs assistance/impaired Eating/Feeding: Independent   Grooming: Standing;Minimal assistance   Upper Body Bathing:  Minimal assistance;Standing   Lower Body Bathing: Minimal assistance;Sitting/lateral leans;Moderate assistance   Upper Body Dressing : Minimal assistance;Standing   Lower Body  Dressing: Moderate assistance;Sit to/from stand Lower Body Dressing Details (indicate cue type and reason): Pt doffed/donned socks seated EOB with Min guard assist. Balance mildly unsteady Toilet Transfer: Ambulation;Minimal assistance   Toileting- Clothing Manipulation and Hygiene: Moderate assistance;Sit to/from stand   Tub/ Shower Transfer: Minimal assistance;Ambulation   Functional mobility during ADLs: Minimal assistance General ADL Comments: No AD used     Vision Baseline Vision/History: 6 Macular Degeneration Vision Assessment?: No apparent visual deficits     Perception     Praxis      Pertinent Vitals/Pain Pain Assessment Pain Assessment: No/denies pain     Hand Dominance     Extremity/Trunk Assessment Upper Extremity Assessment Upper Extremity Assessment: Overall WFL for tasks assessed   Lower Extremity Assessment Lower Extremity Assessment: Defer to PT evaluation       Communication Communication Communication: Receptive difficulties (Mild receptive difficulties noted when responding to OT questions)   Cognition Arousal/Alertness: Awake/alert Behavior During Therapy: WFL for tasks assessed/performed Overall Cognitive Status: Impaired/Different from baseline Area of Impairment: Memory, Orientation, Following commands                 Orientation Level: Time, Disoriented to   Memory: Decreased short-term memory Following Commands: Follows one step commands with increased time       General Comments: Daughter reports "minor cognitive impairments" recently.     General Comments  VSS on RA during functional ambulation, Pt returned to 3L 02 during desat to 88% while laying supine in bed.    Exercises     Shoulder Instructions      Home Living Family/patient expects to be discharged to:: Private residence Living Arrangements: Children (lives with daughter and daughter's husband) Available Help at Discharge: Family;Available 24 hours/day Type  of Home: House Home Access: Stairs to enter [employer] of Steps: 2 Entrance Stairs-Rails: None Home Layout: Two level Alternate Level Stairs-Number of Steps: 16 total Alternate Level Stairs-Rails: Right;Can reach both (Stairs with R rails, then a landing, followed by 4 stairs with bilat rails) Bathroom Shower/Tub: Walk-in shower;Tub/shower unit (Pt has recently been using walk-in shower for safety)   Bathroom Toilet: Standard Bathroom Accessibility: No   Home Equipment: Transport chair;Grab bars - tub/shower;Shower seat          Prior Functioning/Environment Prior Level of Function : Needs assist       Physical Assist : ADLs (physical)   ADLs (physical): Bathing;Dressing Mobility Comments: Pt family reports Pt was independent in the home, ambulating with no AD.  Pt would use transport chair for longer coomunity distances ADLs Comments: Patient was independent in bADLs up until 3 weeks ago, no her daughter has been assisting with dressing and bathign.        OT Problem List: Impaired balance (sitting and/or standing);Decreased safety awareness;Decreased activity tolerance      OT Treatment/Interventions: Self-care/ADL training;Energy conservation;Balance training;Therapeutic exercise;Therapeutic activities;Patient/family education;DME and/or AE instruction    OT Goals(Current goals can be found in the care plan section) Acute Rehab OT Goals Patient Stated Goal: To go home OT Goal Formulation: With patient Time For Goal Achievement: 05/11/22 Potential to Achieve Goals: Good ADL Goals Pt Will Perform Grooming: with supervision;with set-up;standing (standing at sink) Pt Will Perform Lower Body Dressing: with min guard assist;sit to/from stand Pt Will Transfer to Toilet: with supervision;ambulating;regular height toilet (using RW if needed) Pt Will Perform  Tub/Shower Transfer: with min guard assist;ambulating;rolling walker  OT Frequency: Min 2X/week     Co-evaluation              AM-PAC OT "6 Clicks" Daily Activity     Outcome Measure Help from another person eating meals?: None Help from another person taking care of personal grooming?: A Little Help from another person toileting, which includes using toliet, bedpan, or urinal?: A Little Help from another person bathing (including washing, rinsing, drying)?: A Lot Help from another person to put on and taking off regular upper body clothing?: A Little Help from another person to put on and taking off regular lower body clothing?: A Lot 6 Click Score: 17   End of Session Equipment Utilized During Treatment: Gait belt;Oxygen Nurse Communication: Mobility status  Activity Tolerance: Patient tolerated treatment well Patient left: with family/visitor present;with bed alarm set;with call bell/phone within reach;in bed  OT Visit Diagnosis: Unsteadiness on feet (R26.81);Other abnormalities of gait and mobility (R26.89)                Time: 1400-1444 OT Time Calculation (min): 44 min Charges:  OT General Charges $OT Visit: 1 Visit OT Evaluation $OT Eval Moderate Complexity: 1 Mod OT Treatments $Therapeutic Activity: 23-37 mins  04/27/2022  AB, OTR/L  Acute Rehabilitation Services  Office: [place] 04/27/2022, 3:41 PM

## 2022-04-28 ENCOUNTER — Inpatient Hospital Stay (HOSPITAL_COMMUNITY): Payer: Medicare Other

## 2022-04-28 DIAGNOSIS — R109 Unspecified abdominal pain: Secondary | ICD-10-CM

## 2022-04-28 DIAGNOSIS — Z7189 Other specified counseling: Secondary | ICD-10-CM

## 2022-04-28 DIAGNOSIS — Z515 Encounter for palliative care: Secondary | ICD-10-CM

## 2022-04-28 DIAGNOSIS — R0602 Shortness of breath: Secondary | ICD-10-CM

## 2022-04-28 DIAGNOSIS — K561 Intussusception: Secondary | ICD-10-CM

## 2022-04-28 DIAGNOSIS — I5023 Acute on chronic systolic (congestive) heart failure: Secondary | ICD-10-CM | POA: Diagnosis not present

## 2022-04-28 LAB — BASIC METABOLIC PANEL
Anion gap: 10 (ref 5–15)
BUN: 31 mg/dL — ABNORMAL HIGH (ref 8–23)
CO2: 26 mmol/L (ref 22–32)
Calcium: 8.8 mg/dL — ABNORMAL LOW (ref 8.9–10.3)
Chloride: 108 mmol/L (ref 98–111)
Creatinine, Ser: 1.34 mg/dL — ABNORMAL HIGH (ref 0.44–1.00)
GFR, Estimated: 38 mL/min — ABNORMAL LOW (ref >60)
Glucose, Bld: 86 mg/dL (ref 70–99)
Potassium: 3.7 mmol/L (ref 3.5–5.1)
Sodium: 144 mmol/L (ref 135–145)

## 2022-04-28 LAB — CBC
HCT: 38.7 % (ref 36.0–46.0)
Hemoglobin: 11.8 g/dL — ABNORMAL LOW (ref 12.0–15.0)
MCH: 32.6 pg (ref 26.0–34.0)
MCHC: 30.5 g/dL (ref 30.0–36.0)
MCV: 106.9 fL — ABNORMAL HIGH (ref 80.0–100.0)
Platelets: 190 10*3/uL (ref 150–400)
RBC: 3.62 MIL/uL — ABNORMAL LOW (ref 3.87–5.11)
RDW: 15.8 % — ABNORMAL HIGH (ref 11.5–15.5)
WBC: 9.6 10*3/uL (ref 4.0–10.5)
nRBC: 0 % (ref 0.0–0.2)

## 2022-04-28 MED ORDER — ADULT MULTIVITAMIN W/MINERALS CH
1.0000 | ORAL_TABLET | Freq: Every day | ORAL | Status: DC
  Administered 2022-04-28 – 2022-05-02 (×5): 1 via ORAL
  Filled 2022-04-28 (×5): qty 1

## 2022-04-28 MED ORDER — HYDROCORTISONE (PERIANAL) 2.5 % EX CREA
TOPICAL_CREAM | Freq: Two times a day (BID) | CUTANEOUS | Status: DC
  Administered 2022-04-30 – 2022-05-01 (×2): 1 via RECTAL
  Filled 2022-04-28: qty 28.35

## 2022-04-28 MED ORDER — PANTOPRAZOLE SODIUM 40 MG IV SOLR
40.0000 mg | Freq: Two times a day (BID) | INTRAVENOUS | Status: DC
  Administered 2022-04-28 – 2022-04-29 (×2): 40 mg via INTRAVENOUS
  Filled 2022-04-28 (×2): qty 10

## 2022-04-28 MED ORDER — FUROSEMIDE 40 MG PO TABS
40.0000 mg | ORAL_TABLET | ORAL | Status: DC
  Administered 2022-04-29 – 2022-05-01 (×2): 40 mg via ORAL
  Filled 2022-04-28 (×3): qty 1

## 2022-04-28 MED ORDER — APIXABAN 2.5 MG PO TABS
2.5000 mg | ORAL_TABLET | Freq: Two times a day (BID) | ORAL | Status: DC
  Administered 2022-04-28 – 2022-05-02 (×9): 2.5 mg via ORAL
  Filled 2022-04-28 (×9): qty 1

## 2022-04-28 MED ORDER — MIDODRINE HCL 5 MG PO TABS
10.0000 mg | ORAL_TABLET | Freq: Two times a day (BID) | ORAL | Status: DC
  Administered 2022-04-28 – 2022-05-02 (×8): 10 mg via ORAL
  Filled 2022-04-28 (×8): qty 2

## 2022-04-28 MED ORDER — FUROSEMIDE 20 MG PO TABS: 20.0000 mg | ORAL_TABLET | Freq: Every day | ORAL | Status: DC

## 2022-04-28 MED ORDER — FUROSEMIDE 20 MG PO TABS
20.0000 mg | ORAL_TABLET | ORAL | Status: DC
  Administered 2022-04-30: 20 mg via ORAL
  Filled 2022-04-28 (×2): qty 1

## 2022-04-28 MED ORDER — BOOST / RESOURCE BREEZE PO LIQD CUSTOM
1.0000 | Freq: Three times a day (TID) | ORAL | Status: DC
  Administered 2022-04-28 – 2022-05-02 (×9): 1 via ORAL

## 2022-04-28 MED ORDER — FUROSEMIDE 10 MG/ML IJ SOLN: 20.0000 mg | Freq: Two times a day (BID) | INTRAMUSCULAR | Status: DC

## 2022-04-28 MED ORDER — MORPHINE SULFATE (PF) 2 MG/ML IV SOLN: 1.0000 mg | INTRAVENOUS | Status: DC | PRN

## 2022-04-28 NOTE — Discharge Instructions (Signed)

## 2022-04-28 NOTE — Progress Notes (Signed)
Physical Therapy Treatment Patient Details Name: Jane [name] MRN: [id number] DOB: 1933/08/08 Today's Date: 04/28/2022   History of Present Illness Patient is a 89 y/o female who presents on 3/17 with RLQ pain and worsening dyspnea with associated edema. Found to have acute on chronic CHF.  Abdominal CT scan-evidence of gastric ulceration and possible jejunal intussusception however MD believes it is malignancy, awaiting GI consult. PMHincludes COPD, CHF, HTN, mitral regurgitation.    PT Comments    Pt with fair tolerance to treatment today. Pt was able to trial rollator in hallway and navigate stairs today however was limited by O2 sats. Pt was received on 3L initially satting at 93%. Pt reported feeling dizzy on stairs and SpO2 assessment was attempted. Initial reading was 79% on 3L however unable to obtain good waveform or further reading despite attempting to warm patients hands. RN made aware and pt could possibly benefit from ear lob monitor. Pt ambulated fairly well with rollator however required several cues for locking brakes and demonstrated poor carryover. No change in DC/DME recs at this time. Pt would benefit from additional sessions for reinforcement of proper use of rollator. PT will continue to follow.  Recommendations for follow up therapy are one component of a multi-disciplinary discharge planning process, led by the attending physician.  Recommendations may be updated based on patient status, additional functional criteria and insurance authorization.  Follow Up Recommendations  Home health PT     Assistance Recommended at Discharge Intermittent Supervision/Assistance  Patient can return home with the following A little help with walking and/or transfers;A little help with bathing/dressing/bathroom;Help with stairs or ramp for entrance;Assist for transportation;Assistance with cooking/housework;Direct supervision/assist for medications management;Direct supervision/assist for  financial management   Equipment Recommendations  Rollator (4 wheels)    Recommendations for Other Services       Precautions / Restrictions Precautions Precautions: Fall;Other (comment) Precaution Comments: watch 02 Restrictions Weight Bearing Restrictions: No     Mobility  Bed Mobility Overal bed mobility: Modified Independent             General bed mobility comments: no assist needed, use of rails.    Transfers Overall transfer level: Needs assistance Equipment used: Rollator (4 wheels) Transfers: Sit to/from Stand Sit to Stand: Min guard           General transfer comment: Pt required multiple cues for locking rollator brakes.    Ambulation/Gait Ambulation/Gait assistance: Supervision Gait Distance (Feet): 300 Feet Assistive device: Rollator (4 wheels) Gait Pattern/deviations: Step-through pattern, Decreased stride length Gait velocity: decreased     General Gait Details: Slow steady gait with rollator. Pt required several cues for using brakes on rollator.   Stairs Stairs: Yes Stairs assistance: Min guard Stair Management: One rail Right, Forwards, Step to pattern Number of Stairs: 2 General stair comments: Pt was able to complete 2 steps however reported feeling dizzy. Attempted to take SpO2 with initial reading 79% on 3L however unable to obtain further readings. Accuracy of inital reading questionable.   Wheelchair Mobility    Modified Rankin (Stroke Patients Only)       Balance Overall balance assessment: Mild deficits observed, not formally tested                                          Cognition Arousal/Alertness: Awake/alert Behavior During Therapy: WFL for tasks assessed/performed Overall Cognitive Status: Impaired/Different from baseline Area  of Impairment: Memory, Following commands                     Memory: Decreased short-term memory Following Commands: Follows one step commands with  increased time       General Comments: Daughter reports "minor cognitive impairments" recently. Tangential at times.        Exercises      General Comments General comments (skin integrity, edema, etc.): Pt received on 3L with initial reading of 93%. Had difficulty obtaining further readings with pulse ox      Pertinent Vitals/Pain Pain Assessment Pain Assessment: No/denies pain    Home Living                          Prior Function            PT Goals (current goals can now be found in the care plan section) Progress towards PT goals: Progressing toward goals    Frequency    Min 1X/week      PT Plan Current plan remains appropriate    Co-evaluation              AM-PAC PT "6 Clicks" Mobility   Outcome Measure  Help needed turning from your back to your side while in a flat bed without using bedrails?: None Help needed moving from lying on your back to sitting on the side of a flat bed without using bedrails?: None Help needed moving to and from a bed to a chair (including a wheelchair)?: A Little Help needed standing up from a chair using your arms (e.g., wheelchair or bedside chair)?: A Little Help needed to walk in hospital room?: A Little Help needed climbing 3-5 steps with a railing? : A Lot 6 Click Score: 19    End of Session Equipment Utilized During Treatment: Gait belt;Oxygen Activity Tolerance: Treatment limited secondary to medical complications (Comment) (Drop in SpO2) Patient left: in bed;with call bell/phone within reach;with bed alarm set;with family/visitor present Nurse Communication: Mobility status;Other (comment) (Pulse Ox reading) PT Visit Diagnosis: Difficulty in walking, not elsewhere classified (R26.2);Unsteadiness on feet (R26.81)     Time: [id number] PT Time Calculation (min) (ACUTE ONLY): 32 min  Charges:  $Gait Training: 23-37 mins                     Shelby Mattocks, PT, DPT Acute Rehab  Services [id number]    Viann Shove 04/28/2022, 4:13 PM

## 2022-04-28 NOTE — Progress Notes (Addendum)
Progress Note  Primary GI: Dr.Wild/ [facility]     Subjective  Chief Complaint: Acute abdominal pain, abnormal CT imaging   Patient with family at bedside, daughter. Provided some of the history.  Patient is been on liquid diet.  States she was in having some issues with large pills specifically calcium.  Also had some issues with liquid and applesauce yesterday feeling like it caused the pressure/burping. Patient had bowel movement this morning, denies melena or hematochezia. Has had some shortness of breath, gets anxious.   Objective   Vital signs in last 24 hours: Temp:  [97.6 F (36.4 C)-97.7 F (36.5 C)] 97.6 F (36.4 C) (03/19 0344) Pulse Rate:  [74-79] 75 (03/19 0802) Resp:  [16-18] 18 (03/19 0344) BP: (93-105)/(51-63) 100/60 (03/19 0802) SpO2:  [97 %] 97 % (03/19 0344) Weight:  [41.3 kg] 41.3 kg (03/19 0344) Last BM Date : 04/27/22 Last BM recorded by nurses in past 5 days No data recorded  General:   Frail, elderly appearing female no discomfort. Heart: Regular rate and rhythm loud systolic murmur Pulm: Decreased breath sounds bilateral bases, on 2 L [place] Abdomen:  Soft, Non-distended AB, Active bowel sounds. No tenderness ., No organomegaly appreciated. Extremities:  with 1+ edema. Neurologic:  Alert and  oriented x4;  No focal deficits.  Psych:  Cooperative. Normal mood and affect.  Intake/Output from previous day: 03/18 0701 - 03/19 0700 In: 1379.2 [I.V.:1379.2] Out: 250 [Urine:250] Intake/Output this shift: Total I/O In: 180 [P.O.:180] Out: -   Studies/Results: DG Abd 2 Views  Result Date: 04/28/2022 CLINICAL DATA:  89 year old female with evidence of gastric ulcer on CT Abdomen and Pelvis. EXAM: ABDOMEN - 2 VIEW COMPARISON:  CT Abdomen and Pelvis 04/26/2022. FINDINGS: Upright and supine views of the abdomen at 0619 hours. Cardiomegaly, cardiac AICD, lung base effusions and patchy opacity redemonstrated. Extensive Aortoiliac calcified atherosclerosis. Paucity  of bowel gas in the upper abdomen. Normal gas containing cecum in the right lower quadrant. No pneumoperitoneum identified. Stable cholecystectomy clips. Excreted IV contrast in the bladder. Stable visualized osseous structures. IMPRESSION: 1. No pneumoperitoneum identified. Paucity of bowel gas in the upper abdomen, nonobstructed pattern elsewhere. 2. Excreted IV contrast in the bladder. 3. Ongoing lung base pleural effusions, patchy opacity. 4.  Aortic Atherosclerosis (ICD10-I70.0). Electronically Signed   By: Genevie Ann M.D.   On: 04/28/2022 08:25   DG Chest Portable 1 View  Result Date: 04/26/2022 CLINICAL DATA:  Right lower quadrant pain that radiates into the right leg. EXAM: PORTABLE CHEST 1 VIEW COMPARISON:  None Available. FINDINGS: Radiopaque sternal fixation plates and screws and vascular clips are present. A multi lead AICD is also seen. There is mild to moderate severity enlargement of the cardiac silhouette with marked severity calcification of the thoracic aorta. Mild to moderate severity diffusely increased interstitial lung markings are seen. Mild atelectasis and/or infiltrate is also present within the mid to lower right lung and left lung base. There is no evidence of a pleural effusion or pneumothorax. Multilevel degenerative changes seen throughout the thoracic spine. IMPRESSION: 1. Cardiomegaly and evidence of prior median sternotomy/CABG with mild to moderate severity interstitial edema. 2. Mild bilateral atelectasis and/or infiltrate. Electronically Signed   By: Virgina Norfolk M.D.   On: 04/26/2022 23:31   CT ABDOMEN PELVIS W CONTRAST  Result Date: 04/26/2022 CLINICAL DATA:  Right lower quadrant pain. EXAM: CT ABDOMEN AND PELVIS WITH CONTRAST TECHNIQUE: Multidetector CT imaging of the abdomen and pelvis was performed using the standard protocol  following bolus administration of intravenous contrast. RADIATION DOSE REDUCTION: This exam was performed according to the departmental  dose-optimization program which includes automated exposure control, adjustment of the mA and/or kV according to patient size and/or use of iterative reconstruction technique. CONTRAST:  27mL OMNIPAQUE IOHEXOL 300 MG/ML  SOLN COMPARISON:  None Available. FINDINGS: Lower chest: Moderate to marked severity areas of scarring and/or atelectasis are seen within the bilateral lung bases. There is a moderate-sized right-sided pleural effusion. A very small left pleural effusion is also noted. Hepatobiliary: No focal liver abnormality is seen. Status post cholecystectomy. No biliary dilatation. Pancreas: Unremarkable. No pancreatic ductal dilatation or surrounding inflammatory changes. Spleen: Normal in size without focal abnormality. Adrenals/Urinary Tract: The right adrenal gland is unremarkable. There is mild diffuse left adrenal gland enlargement. Kidneys are normal in size, without renal calculi or hydronephrosis. A 6.2 cm x 6.2 cm x 9.7 cm lobulated, multi septated, exophytic cyst is seen extending from the lower pole of the left kidney. Numerous subcentimeter bilateral simple renal cysts are also seen. The urinary bladder is poorly distended and is otherwise unremarkable. Stomach/Bowel: A markedly thickened hypodense gastric antrum is seen (approximately 59.86 Hounsfield units). A 1.7 cm x 1.6 cm x 1.4 cm fluid-filled area of outpouching is noted along the dorsal aspect of the gastric antrum (axial CT image 31, CT series 2/coronal reformatted image 56, CT series 5). Diffusely dense gastric mucosa is also seen. The appendix is not clearly identified. A short segment of intussuscepted proximal duodenum is noted just beyond the duodenal bulb (best seen on coronal reformatted images 50 through 55, CT series 5). No evidence of bowel dilatation. Noninflamed diverticula are seen throughout the sigmoid colon. Vascular/Lymphatic: Aortic atherosclerosis. No enlarged abdominal or pelvic lymph nodes. Reproductive: Multiple  heterogeneous uterine fibroids are seen. The largest measures approximately 2.7 cm by 2.0 cm x 1.6 cm and is seen within the lateral aspect of the uterus on the left. The bilateral adnexa are unremarkable. Other: No abdominal wall hernia or abnormality. No abdominopelvic ascites. Musculoskeletal: Multilevel degenerative changes are seen throughout the lumbar spine. IMPRESSION: 1. 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 Sequelae associated with a large gastric ulcer and/or an underlying neoplastic process cannot be excluded. Correlation with upper endoscopy is recommended. 2. Short segment of intussusception involving the proximal duodenum. This may be transient in nature. 3. Sigmoid diverticulosis. 4. Multiple heterogeneous uterine fibroids. Correlation with nonemergent pelvic ultrasound is recommended. 5. Moderate to marked severity bibasilar scarring and/or atelectasis. 6. Bilateral pleural effusions, right greater than left. 7. Evidence of prior cholecystectomy. 8. Large, lobulated, multi septated, exophytic left renal cyst. Further evaluation with nonemergent renal ultrasound is recommended. 9. Aortic atherosclerosis. Aortic Atherosclerosis (ICD10-I70.0). Electronically Signed   By: Virgina Norfolk M.D.   On: 04/26/2022 23:27    Lab Results: Recent Labs    04/26/22 2122 04/28/22 0021  WBC 10.0 9.6  HGB 13.7 11.8*  HCT 44.4 38.7  PLT 185 190   BMET Recent Labs    04/26/22 2122 04/28/22 0021  NA 141 144  K 3.2* 3.7  CL 108 108  CO2 23 26  GLUCOSE 118* 86  BUN 36* 31*  CREATININE 1.47* 1.34*  CALCIUM 9.1 8.8*   LFT Recent Labs    04/26/22 2122  PROT 7.1  ALBUMIN 3.4*  AST 16  ALT 15  ALKPHOS 65  BILITOT 1.1   PT/INR No results for input(s): "LABPROT", "INR" in the last 72 hours.   Scheduled  Meds:  atorvastatin  20 mg Oral QHS   docusate sodium  100 mg Oral BID   furosemide  20 mg Oral QODAY   [START ON 04/29/2022] furosemide  40 mg Oral  QODAY   methimazole  2.5 mg Oral Daily   metoprolol tartrate  25 mg Oral BID   midodrine  10 mg Oral BID WC   pantoprazole (PROTONIX) IV  40 mg Intravenous Daily   senna-docusate  1 tablet Oral Daily   sodium chloride flush  3 mL Intravenous Q12H   spironolactone  12.5 mg Oral Daily   Continuous Infusions:    Patient profile:   89 year old white female with history of biventricular congestive heart failure, coronary artery disease status post CABG 2016, history of atrial fibrillation, she is status post pacemaker and ICD placement, has history of severe mitral regurgitation, aortic stenosis, chronic respiratory failure on O2 2 L chronically presents with AB discomfort and beltching/burping.    Impression/Plan:   Abdominal discomfort, eructation, dyspepsia, bloating in setting of severe CHF/right sided heart failure March 2023 upper GI at [place] PPI twice daily Pending upper GI Continue clear liquid diet for now until after UGI, can consider SLP if UGI negative, some issues with liquids/applesauce Patient reporting endoscopic candidate due to multiple comorbid conditions including acute congestive heart failure/inoperable severe AS/MR  Possible short segment intussusception of the duodenum versus diverticulum Asymptomatic Will repeat upper GI for evaluation  Acute on chronic heart failure status post AICD Continue gentle diuresis Cardiology following  A-fib On Eliquis  CAD status post PCIs and CABG Negative troponin no chest pain  Mitral valve regurgitation severe not a surgical candidate  Principal Problem:   Acute on chronic systolic (congestive) heart failure (HCC) Active Problems:   Abdominal pain   Elevated brain natriuretic peptide (BNP) level   Shortness of breath   Counseling regarding goals of care    LOS: 1 day   Vladimir Crofts  04/28/2022, 10:08 AM   Attending physician's note   I have taken a history, reviewed the chart and examined the  patient. I performed a substantive portion of this encounter, including complete performance of at least one of the key components, in conjunction with the APP. I agree with the APP's note, impression and recommendations.    Await upper GI series to further evaluate ?  Short segment intussusception in the duodenum versus diverticula  Most of her upper GI symptoms are likely secondary to decompensated right heart failure, improving with diuresis  Patient is not a candidate to undergo EGD, excess risk for potential anesthesia and procedure related complications  The patient and family at bedside were provided an opportunity to ask questions and all were answered. They agreed with the plan and demonstrated an understanding of the instructions.   Damaris Hippo , MD [phone]

## 2022-04-28 NOTE — Progress Notes (Addendum)
PROGRESS NOTE    Jane [name]  [id number] DOB: [birth year] DOA: 04/26/2022 PCP: System, Provider Not In  89/F with history of COPD on home O2, CAD, chronic diastolic CHF, severe mitral regurgitation not a candidate for mitral clip, paroxysmal A-fib on Eliquis, ICD, hiatal hernia presented to the ED with multiple complaints, has been having increased shortness of breath in the last few days, has taken multiple doses of nitroglycerin for this, denies any chest pain, in addition has been having some intermittent nausea, belching, early satiety etc. In the ED BNP was 1800, CT abdomen was concerning for possible gastric ulceration, jejunal intussusception, seen by gastroenterology, and general surgery, supportive care recommended, upper GI series ordered,  Subjective: Sitting up in bed eating breakfast, belching, appears tachypneic as well  Assessment and Plan:   Acute on chronic diastolic CHF  severe MR,  h/o CAD -Severe MR on echo in 10/2020 followed by cardiology at [facility] and [facility], not felt to be a candidate for mitral clip -Suspect severe MR is contributing to her symptoms this admission, had worsening dyspnea in the last few weeks, used multiple doses of nitroglycerin, BNP elevated 1800, chest x-ray with interstitial edema, CT abdomen with bilateral pleural effusions, moderate on R -Start IV Lasix today, continue Aldactone -Add midodrine to support blood pressure, resume Jardiance tomorrow if blood pressure tolerates -Cards following, FU repeat ECHO -Palliative consulted for goals of care  Abdominal pain, abnormal imaging -Question gastric diverticulum versus malignancy and jejunal intussusception on CT abdomen yesterday -Also large left renal cystic mass noted, workup for this deferred as inpatient -No symptoms of obstruction, no vomiting, having BMs -GI following, continue PPI, upper GI series ordered -Supportive care, diet resumed  COPD -Supposed to be on 2L home O2 but she  generally chooses not to wear O2   Afib -Has pacer/AICD -Resume Eliquis, no procedures planned, no overt evidence of active GI bleed   HTN -Continue metoprolol, hold lisinopril   HLD -Continue Lipitor   DM -Holding Jardiance   Renal cyst -Incidental finding on imaging -No plan for further workup of this at this time, defer to PCP   Hyperthyroidism -Continue methimazole   Goals of care -Overall prognosis appears poor, MitraClip was still declined, felt to be a poor chronic candidate -Given her progressive decline, frailty, multiple medical problems and advanced age, discussed prognosis with patient and daughter today, recommended conservative management, consideration DNR, palliative consulted     Advance Care Planning:   Code Status: Full Code    Consults: Cardiology; Surgery; GI; CHF   DVT Prophylaxis: Eliquis Family Communication: Daughter present at bedside Disposition Plan: Home pending clinical improvement  Consultants:    Procedures:   Antimicrobials:    Objective: Vitals:   04/27/22 2003 04/27/22 2339 04/28/22 0344 04/28/22 0802  BP: (!) 105/58 (!) 93/51 99/63 100/60  Pulse: 79 77 74 75  Resp: 16 18 18    Temp: 97.7 F (36.5 C) 97.6 F (36.4 C) 97.6 F (36.4 C)   TempSrc: Oral Oral Oral   SpO2: 97% 97% 97%   Weight:   41.3 kg     Intake/Output Summary (Last 24 hours) at 04/28/2022 1108 Last data filed at 04/28/2022 0900 Gross per 24 hour  Intake 1559.17 ml  Output 250 ml  Net 1309.17 ml   Filed Weights   04/28/22 0344  Weight: 41.3 kg    Examination:  General exam: Frail chronically ill female sitting up in bed, tachypneic, AAOx3 HEENT: Positive JVD CVS: S1-S2, regular rhythm, systolic  murmur Lungs: Rare basilar Rales, decreased breath sounds to bases Abdomen: Soft, nontender, bowel sounds present Extremities: No edema  Skin: No rashes Psychiatry:  Mood & affect appropriate.     Data Reviewed:   CBC: Recent Labs  Lab  04/26/22 2122 04/28/22 0021  WBC 10.0 9.6  HGB 13.7 11.8*  HCT 44.4 38.7  MCV 104.7* 106.9*  PLT 185 [id number]   Basic Metabolic Panel: Recent Labs  Lab 04/26/22 2122 04/28/22 0021  NA 141 144  K 3.2* 3.7  CL 108 108  CO2 23 26  GLUCOSE 118* 86  BUN 36* 31*  CREATININE 1.47* 1.34*  CALCIUM 9.1 8.8*   GFR: CrCl cannot be calculated (Unknown ideal weight.). Liver Function Tests: Recent Labs  Lab 04/26/22 2122  AST 16  ALT 15  ALKPHOS 65  BILITOT 1.1  PROT 7.1  ALBUMIN 3.4*   Recent Labs  Lab 04/26/22 2122  LIPASE 36   No results for input(s): "AMMONIA" in the last 168 hours. Coagulation Profile: No results for input(s): "INR", "PROTIME" in the last 168 hours. Cardiac Enzymes: No results for input(s): "CKTOTAL", "CKMB", "CKMBINDEX", "TROPONINI" in the last 168 hours. BNP (last 3 results) No results for input(s): "PROBNP" in the last 8760 hours. HbA1C: No results for input(s): "HGBA1C" in the last 72 hours. CBG: No results for input(s): "GLUCAP" in the last 168 hours. Lipid Profile: No results for input(s): "CHOL", "HDL", "LDLCALC", "TRIG", "CHOLHDL", "LDLDIRECT" in the last 72 hours. Thyroid Function Tests: No results for input(s): "TSH", "T4TOTAL", "FREET4", "T3FREE", "THYROIDAB" in the last 72 hours. Anemia Panel: No results for input(s): "VITAMINB12", "FOLATE", "FERRITIN", "TIBC", "IRON", "RETICCTPCT" in the last 72 hours. Urine analysis:    Component Value Date/Time   COLORURINE YELLOW 04/27/2022 0512   APPEARANCEUR CLEAR 04/27/2022 0512   LABSPEC 1.010 04/27/2022 0512   PHURINE 5.0 04/27/2022 0512   GLUCOSEU NEGATIVE 04/27/2022 0512   HGBUR NEGATIVE 04/27/2022 0512   BILIRUBINUR NEGATIVE 04/27/2022 0512   KETONESUR NEGATIVE 04/27/2022 0512   PROTEINUR NEGATIVE 04/27/2022 0512   NITRITE NEGATIVE 04/27/2022 0512   LEUKOCYTESUR TRACE (A) 04/27/2022 0512   Sepsis Labs: @LABRCNTIP (procalcitonin:4,lacticidven:4)  ) Recent Results (from the past 240  hour(s))  Resp panel by RT-PCR (RSV, Flu A&B, Covid) Anterior Nasal Swab     Status: None   Collection Time: 04/26/22 10:42 PM   Specimen: Anterior Nasal Swab  Result Value Ref Range Status   SARS Coronavirus 2 by RT PCR NEGATIVE NEGATIVE Final    Comment: (NOTE) SARS-CoV-2 target nucleic acids are NOT DETECTED.  The SARS-CoV-2 RNA is generally detectable in upper respiratory specimens during the acute phase of infection. The lowest concentration of SARS-CoV-2 viral copies this assay can detect is 138 copies/mL. A negative result does not preclude SARS-Cov-2 infection and should not be used as the sole basis for treatment or other patient management decisions. A negative result may occur with  improper specimen collection/handling, submission of specimen other than nasopharyngeal swab, presence of viral mutation(s) within the areas targeted by this assay, and inadequate number of viral copies(<138 copies/mL). A negative result must be combined with clinical observations, patient history, and epidemiological information. The expected result is Negative.  Fact Sheet for Patients:  EntrepreneurPulse.com.au  Fact Sheet for Healthcare Providers:  IncredibleEmployment.be  This test is no t yet approved or cleared by the [place] FDA and  has been authorized for detection and/or diagnosis of SARS-CoV-2 by FDA under an Emergency Use Authorization (EUA). This EUA will remain  in effect (meaning this test can be used) for the duration of the COVID-19 declaration under Section 564(b)(1) of the Act, 21 U.S.C.section 360bbb-3(b)(1), unless the authorization is terminated  or revoked sooner.       Influenza A by PCR NEGATIVE NEGATIVE Final   Influenza B by PCR NEGATIVE NEGATIVE Final    Comment: (NOTE) The Xpert Xpress SARS-CoV-2/FLU/RSV plus assay is intended as an aid in the diagnosis of influenza from Nasopharyngeal swab specimens and should not  be used as a sole basis for treatment. Nasal washings and aspirates are unacceptable for Xpert Xpress SARS-CoV-2/FLU/RSV testing.  Fact Sheet for Patients: EntrepreneurPulse.com.au  Fact Sheet for Healthcare Providers: IncredibleEmployment.be  This test is not yet approved or cleared by the [place] FDA and has been authorized for detection and/or diagnosis of SARS-CoV-2 by FDA under an Emergency Use Authorization (EUA). This EUA will remain in effect (meaning this test can be used) for the duration of the COVID-19 declaration under Section 564(b)(1) of the Act, 21 U.S.C. section 360bbb-3(b)(1), unless the authorization is terminated or revoked.     Resp Syncytial Virus by PCR NEGATIVE NEGATIVE Final    Comment: (NOTE) Fact Sheet for Patients: EntrepreneurPulse.com.au  Fact Sheet for Healthcare Providers: IncredibleEmployment.be  This test is not yet approved or cleared by the [place] FDA and has been authorized for detection and/or diagnosis of SARS-CoV-2 by FDA under an Emergency Use Authorization (EUA). This EUA will remain in effect (meaning this test can be used) for the duration of the COVID-19 declaration under Section 564(b)(1) of the Act, 21 U.S.C. section 360bbb-3(b)(1), unless the authorization is terminated or revoked.  Performed at [facility], [address] [place], [place] [address]   Urine Culture (for pregnant, neutropenic or urologic patients or patients with an indwelling urinary catheter)     Status: Abnormal (Preliminary result)   Collection Time: 04/27/22  5:15 AM   Specimen: Urine, Clean Catch  Result Value Ref Range Status   Specimen Description   Final    URINE, CLEAN CATCH Performed at [facility], [address] [place], [place] [address]    Special Requests   Final    NONE Performed at [facility], [address]  [place], [place] [address]    Culture (A)  Final    30,000 COLONIES/mL ENTEROCOCCUS FAECALIS SUSCEPTIBILITIES TO FOLLOW Performed at [facility], [place] [address], [place], [place] [address]    Report Status PENDING  Incomplete     Radiology Studies: DG Abd 2 Views  Result Date: 04/28/2022 CLINICAL DATA:  89 year old female with evidence of gastric ulcer on CT Abdomen and Pelvis. EXAM: ABDOMEN - 2 VIEW COMPARISON:  CT Abdomen and Pelvis 04/26/2022. FINDINGS: Upright and supine views of the abdomen at 0619 hours. Cardiomegaly, cardiac AICD, lung base effusions and patchy opacity redemonstrated. Extensive Aortoiliac calcified atherosclerosis. Paucity of bowel gas in the upper abdomen. Normal gas containing cecum in the right lower quadrant. No pneumoperitoneum identified. Stable cholecystectomy clips. Excreted IV contrast in the bladder. Stable visualized osseous structures. IMPRESSION: 1. No pneumoperitoneum identified. Paucity of bowel gas in the upper abdomen, nonobstructed pattern elsewhere. 2. Excreted IV contrast in the bladder. 3. Ongoing lung base pleural effusions, patchy opacity. 4.  Aortic Atherosclerosis (ICD10-I70.0). Electronically Signed   By: Genevie Ann M.D.   On: 04/28/2022 08:25   DG Chest Portable 1 View  Result Date: 04/26/2022 CLINICAL DATA:  Right lower quadrant pain  that radiates into the right leg. EXAM: PORTABLE CHEST 1 VIEW COMPARISON:  None Available. FINDINGS: Radiopaque sternal fixation plates and screws and vascular clips are present. A multi lead AICD is also seen. There is mild to moderate severity enlargement of the cardiac silhouette with marked severity calcification of the thoracic aorta. Mild to moderate severity diffusely increased interstitial lung markings are seen. Mild atelectasis and/or infiltrate is also present within the mid to lower right lung and left lung base. There is no evidence of a pleural effusion or pneumothorax. Multilevel degenerative changes seen  throughout the thoracic spine. IMPRESSION: 1. Cardiomegaly and evidence of prior median sternotomy/CABG with mild to moderate severity interstitial edema. 2. Mild bilateral atelectasis and/or infiltrate. Electronically Signed   By: Virgina Norfolk M.D.   On: 04/26/2022 23:31   CT ABDOMEN PELVIS W CONTRAST  Result Date: 04/26/2022 CLINICAL DATA:  Right lower quadrant pain. EXAM: CT ABDOMEN AND PELVIS WITH CONTRAST TECHNIQUE: Multidetector CT imaging of the abdomen and pelvis was performed using the standard protocol following bolus administration of intravenous contrast. RADIATION DOSE REDUCTION: This exam was performed according to the departmental dose-optimization program which includes automated exposure control, adjustment of the mA and/or kV according to patient size and/or use of iterative reconstruction technique. CONTRAST:  2mL OMNIPAQUE IOHEXOL 300 MG/ML  SOLN COMPARISON:  None Available. FINDINGS: Lower chest: Moderate to marked severity areas of scarring and/or atelectasis are seen within the bilateral lung bases. There is a moderate-sized right-sided pleural effusion. A very small left pleural effusion is also noted. Hepatobiliary: No focal liver abnormality is seen. Status post cholecystectomy. No biliary dilatation. Pancreas: Unremarkable. No pancreatic ductal dilatation or surrounding inflammatory changes. Spleen: Normal in size without focal abnormality. Adrenals/Urinary Tract: The right adrenal gland is unremarkable. There is mild diffuse left adrenal gland enlargement. Kidneys are normal in size, without renal calculi or hydronephrosis. A 6.2 cm x 6.2 cm x 9.7 cm lobulated, multi septated, exophytic cyst is seen extending from the lower pole of the left kidney. Numerous subcentimeter bilateral simple renal cysts are also seen. The urinary bladder is poorly distended and is otherwise unremarkable. Stomach/Bowel: A markedly thickened hypodense gastric antrum is seen (approximately 59.86  Hounsfield units). A 1.7 cm x 1.6 cm x 1.4 cm fluid-filled area of outpouching is noted along the dorsal aspect of the gastric antrum (axial CT image 31, CT series 2/coronal reformatted image 56, CT series 5). Diffusely dense gastric mucosa is also seen. The appendix is not clearly identified. A short segment of intussuscepted proximal duodenum is noted just beyond the duodenal bulb (best seen on coronal reformatted images 50 through 55, CT series 5). No evidence of bowel dilatation. Noninflamed diverticula are seen throughout the sigmoid colon. Vascular/Lymphatic: Aortic atherosclerosis. No enlarged abdominal or pelvic lymph nodes. Reproductive: Multiple heterogeneous uterine fibroids are seen. The largest measures approximately 2.7 cm by 2.0 cm x 1.6 cm and is seen within the lateral aspect of the uterus on the left. The bilateral adnexa are unremarkable. Other: No abdominal wall hernia or abnormality. No abdominopelvic ascites. Musculoskeletal: Multilevel degenerative changes are seen throughout the lumbar spine. IMPRESSION: 1. 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 Sequelae associated with a large gastric ulcer and/or an underlying neoplastic process cannot be excluded. Correlation with upper endoscopy is recommended. 2. Short segment of intussusception involving the proximal duodenum. This may be transient in nature. 3. Sigmoid diverticulosis. 4. Multiple heterogeneous uterine fibroids. Correlation with nonemergent pelvic ultrasound is recommended. 5. Moderate  to marked severity bibasilar scarring and/or atelectasis. 6. Bilateral pleural effusions, right greater than left. 7. Evidence of prior cholecystectomy. 8. Large, lobulated, multi septated, exophytic left renal cyst. Further evaluation with nonemergent renal ultrasound is recommended. 9. Aortic atherosclerosis. Aortic Atherosclerosis (ICD10-I70.0). Electronically Signed   By: Virgina Norfolk M.D.   On: 04/26/2022 23:27      Scheduled Meds:  apixaban  2.5 mg Oral BID   atorvastatin  20 mg Oral QHS   docusate sodium  100 mg Oral BID   furosemide  20 mg Oral QODAY   [START ON 04/29/2022] furosemide  40 mg Oral QODAY   methimazole  2.5 mg Oral Daily   metoprolol tartrate  25 mg Oral BID   midodrine  10 mg Oral BID WC   pantoprazole (PROTONIX) IV  40 mg Intravenous Daily   senna-docusate  1 tablet Oral Daily   sodium chloride flush  3 mL Intravenous Q12H   spironolactone  12.5 mg Oral Daily   Continuous Infusions:   LOS: 1 day    Time spent: 43min    Domenic Polite, MD [facility]   04/28/2022, 11:08 AM

## 2022-04-28 NOTE — Progress Notes (Signed)
Rounding Note    Patient Name: Jane [name] Date of Encounter: 04/28/2022  [place] Cardiologist: None   Subjective   She states she has no chest pain today, feels a little SOB at rest, no more abdominal pain, still has blenching episodes. She was able to eat breakfast and tolerate it. She can't distinguish at time for her chest discomfort and SOB versus GI symptoms. She brings up off topics frequently, not a good historian. She is very pleasant.   Inpatient Medications    Scheduled Meds:  atorvastatin  20 mg Oral QHS   docusate sodium  100 mg Oral BID   furosemide  20 mg Oral Daily   methimazole  2.5 mg Oral Daily   metoprolol tartrate  25 mg Oral BID   midodrine  10 mg Oral BID WC   pantoprazole (PROTONIX) IV  40 mg Intravenous Daily   senna-docusate  1 tablet Oral Daily   sodium chloride flush  3 mL Intravenous Q12H   spironolactone  12.5 mg Oral Daily   Continuous Infusions:  PRN Meds: acetaminophen **OR** acetaminophen, bisacodyl, hydrALAZINE, morphine injection, ondansetron **OR** ondansetron (ZOFRAN) IV, polyethylene glycol, witch hazel-glycerin   Vital Signs    Vitals:   04/27/22 2003 04/27/22 2339 04/28/22 0344 04/28/22 0802  BP: (!) 105/58 (!) 93/51 99/63 100/60  Pulse: 79 77 74 75  Resp: 16 18 18    Temp: 97.7 F (36.5 C) 97.6 F (36.4 C) 97.6 F (36.4 C)   TempSrc: Oral Oral Oral   SpO2: 97% 97% 97%   Weight:   41.3 kg     Intake/Output Summary (Last 24 hours) at 04/28/2022 0937 Last data filed at 04/28/2022 0900 Gross per 24 hour  Intake 1559.17 ml  Output 250 ml  Net 1309.17 ml      04/28/2022    3:44 AM 12/31/2020   10:54 PM 02/13/2015   11:24 AM  Last 3 Weights  Weight (lbs) 91 lb 0.8 oz 97 lb 107 lb  Weight (kg) 41.3 kg 43.999 kg 48.535 kg      Telemetry    AV paced rhythm predominately - Personally Reviewed  ECG    N/A today - Personally Reviewed  Physical Exam   GEN: No acute distress.  Cachectic. Pleasant    Neck: JVD elevated mid neck with HOB 30 degree  Cardiac: Irregular, grade IV holosystolic murmur at apex  Respiratory: Clear to auscultation bilaterally.on [place] oxygen  GI: Soft, nontender MS: No leg edema. Decreased skin turgor  Neuro:  Nonfocal  Psych: Normal affect   Labs    High Sensitivity Troponin:   Recent Labs  Lab 04/26/22 2241 04/27/22 0202  TROPONINIHS 38* 32*     Chemistry Recent Labs  Lab 04/26/22 2122 04/28/22 0021  NA 141 144  K 3.2* 3.7  CL 108 108  CO2 23 26  GLUCOSE 118* 86  BUN 36* 31*  CREATININE 1.47* 1.34*  CALCIUM 9.1 8.8*  PROT 7.1  --   ALBUMIN 3.4*  --   AST 16  --   ALT 15  --   ALKPHOS 65  --   BILITOT 1.1  --   GFRNONAA 34* 38*  ANIONGAP 10 10    Lipids No results for input(s): "CHOL", "TRIG", "HDL", "LABVLDL", "LDLCALC", "CHOLHDL" in the last 168 hours.  Hematology Recent Labs  Lab 04/26/22 2122 04/28/22 0021  WBC 10.0 9.6  RBC 4.24 3.62*  HGB 13.7 11.8*  HCT 44.4 38.7  MCV 104.7* 106.9*  MCH 32.3 32.6  MCHC 30.9 30.5  RDW 16.1* 15.8*  PLT 185 190   Thyroid No results for input(s): "TSH", "FREET4" in the last 168 hours.  BNP Recent Labs  Lab 04/26/22 2241  BNP 1,839.9*    DDimer No results for input(s): "DDIMER" in the last 168 hours.   Radiology    DG Abd 2 Views  Result Date: 04/28/2022 CLINICAL DATA:  89 year old female with evidence of gastric ulcer on CT Abdomen and Pelvis. EXAM: ABDOMEN - 2 VIEW COMPARISON:  CT Abdomen and Pelvis 04/26/2022. FINDINGS: Upright and supine views of the abdomen at 0619 hours. Cardiomegaly, cardiac AICD, lung base effusions and patchy opacity redemonstrated. Extensive Aortoiliac calcified atherosclerosis. Paucity of bowel gas in the upper abdomen. Normal gas containing cecum in the right lower quadrant. No pneumoperitoneum identified. Stable cholecystectomy clips. Excreted IV contrast in the bladder. Stable visualized osseous structures. IMPRESSION: 1. No pneumoperitoneum identified.  Paucity of bowel gas in the upper abdomen, nonobstructed pattern elsewhere. 2. Excreted IV contrast in the bladder. 3. Ongoing lung base pleural effusions, patchy opacity. 4.  Aortic Atherosclerosis (ICD10-I70.0). Electronically Signed   By: Genevie Ann M.D.   On: 04/28/2022 08:25   DG Chest Portable 1 View  Result Date: 04/26/2022 CLINICAL DATA:  Right lower quadrant pain that radiates into the right leg. EXAM: PORTABLE CHEST 1 VIEW COMPARISON:  None Available. FINDINGS: Radiopaque sternal fixation plates and screws and vascular clips are present. A multi lead AICD is also seen. There is mild to moderate severity enlargement of the cardiac silhouette with marked severity calcification of the thoracic aorta. Mild to moderate severity diffusely increased interstitial lung markings are seen. Mild atelectasis and/or infiltrate is also present within the mid to lower right lung and left lung base. There is no evidence of a pleural effusion or pneumothorax. Multilevel degenerative changes seen throughout the thoracic spine. IMPRESSION: 1. Cardiomegaly and evidence of prior median sternotomy/CABG with mild to moderate severity interstitial edema. 2. Mild bilateral atelectasis and/or infiltrate. Electronically Signed   By: Virgina Norfolk M.D.   On: 04/26/2022 23:31   CT ABDOMEN PELVIS W CONTRAST  Result Date: 04/26/2022 CLINICAL DATA:  Right lower quadrant pain. EXAM: CT ABDOMEN AND PELVIS WITH CONTRAST TECHNIQUE: Multidetector CT imaging of the abdomen and pelvis was performed using the standard protocol following bolus administration of intravenous contrast. RADIATION DOSE REDUCTION: This exam was performed according to the departmental dose-optimization program which includes automated exposure control, adjustment of the mA and/or kV according to patient size and/or use of iterative reconstruction technique. CONTRAST:  29mL OMNIPAQUE IOHEXOL 300 MG/ML  SOLN COMPARISON:  None Available. FINDINGS: Lower chest:  Moderate to marked severity areas of scarring and/or atelectasis are seen within the bilateral lung bases. There is a moderate-sized right-sided pleural effusion. A very small left pleural effusion is also noted. Hepatobiliary: No focal liver abnormality is seen. Status post cholecystectomy. No biliary dilatation. Pancreas: Unremarkable. No pancreatic ductal dilatation or surrounding inflammatory changes. Spleen: Normal in size without focal abnormality. Adrenals/Urinary Tract: The right adrenal gland is unremarkable. There is mild diffuse left adrenal gland enlargement. Kidneys are normal in size, without renal calculi or hydronephrosis. A 6.2 cm x 6.2 cm x 9.7 cm lobulated, multi septated, exophytic cyst is seen extending from the lower pole of the left kidney. Numerous subcentimeter bilateral simple renal cysts are also seen. The urinary bladder is poorly distended and is otherwise unremarkable. Stomach/Bowel: A markedly thickened hypodense gastric antrum is seen (approximately 59.86 Hounsfield units).  A 1.7 cm x 1.6 cm x 1.4 cm fluid-filled area of outpouching is noted along the dorsal aspect of the gastric antrum (axial CT image 31, CT series 2/coronal reformatted image 56, CT series 5). Diffusely dense gastric mucosa is also seen. The appendix is not clearly identified. A short segment of intussuscepted proximal duodenum is noted just beyond the duodenal bulb (best seen on coronal reformatted images 50 through 55, CT series 5). No evidence of bowel dilatation. Noninflamed diverticula are seen throughout the sigmoid colon. Vascular/Lymphatic: Aortic atherosclerosis. No enlarged abdominal or pelvic lymph nodes. Reproductive: Multiple heterogeneous uterine fibroids are seen. The largest measures approximately 2.7 cm by 2.0 cm x 1.6 cm and is seen within the lateral aspect of the uterus on the left. The bilateral adnexa are unremarkable. Other: No abdominal wall hernia or abnormality. No abdominopelvic ascites.  Musculoskeletal: Multilevel degenerative changes are seen throughout the lumbar spine. IMPRESSION: 1. 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 Sequelae associated with a large gastric ulcer and/or an underlying neoplastic process cannot be excluded. Correlation with upper endoscopy is recommended. 2. Short segment of intussusception involving the proximal duodenum. This may be transient in nature. 3. Sigmoid diverticulosis. 4. Multiple heterogeneous uterine fibroids. Correlation with nonemergent pelvic ultrasound is recommended. 5. Moderate to marked severity bibasilar scarring and/or atelectasis. 6. Bilateral pleural effusions, right greater than left. 7. Evidence of prior cholecystectomy. 8. Large, lobulated, multi septated, exophytic left renal cyst. Further evaluation with nonemergent renal ultrasound is recommended. 9. Aortic atherosclerosis. Aortic Atherosclerosis (ICD10-I70.0). Electronically Signed   By: Virgina Norfolk M.D.   On: 04/26/2022 23:27    Cardiac Studies   Echo from 11/06/20 at [facility]:   LEFT VENTRICLE                                      Anterior: Normal          Size: Normal                                 Lateral: Normal   Contraction: Normal                                  Septal: Normal    Closest EF: >55%(Estimated)  Calc.EF: 66% (3D)      Apical: Normal     LV masses: No Masses                             Inferior: Normal           LVH: None                                 Posterior: Normal   LV GLS(TT): -12.2% Normal Range ....Marland KitchenMarland Kitchen[nrrf]       LV Note: E/A = 2.3  Dias.FxClass: N/A   MITRAL VALVE      Leaflets: ABNORMAL                Mobility: PARTIALLY MOBILE    Morphology: THICKENED LEAFLET(S) BOTH MOD       MV Note: CALCIFIED & IMMOBILE PMVL.  MAC ALSO.   LEFT ATRIUM  Size: SEVERELY ENLARGED     LA masses: No masses                Normal IAS   MAIN PA          Size: Normal     NORMAL LEFT VENTRICULAR SYSTOLIC  FUNCTION WITH NO LVH    NORMAL RIGHT VENTRICULAR SYSTOLIC FUNCTION    VALVULAR REGURGITATION: SEVERE MR, TRIVIAL PR, MODERATE TR    VALVULAR STENOSIS: TRIVIAL AS, MILD MS    SEVERE MR.  PISA EROA = 0.40 cm2.  MR VENA CONTRACTA = 0.51 cm.  MR RV =    67 ml.    GRADIENT ACROSS AOV MAY BE UNDERESTIMATING DEGREE OF AORTIC STENOSIS GIVEN    VALVE APPEARANCE (AORTIC STENOSIS NOT FULLY EVALUATED).   Patient Profile     89 y.o. female with PMH of CAD with CABG and multiple stents, severe MR not amenable to TEER due to mild mitral stenosis, paroxysmal atrial fibrillation, prior ischemic cardiomyopathy with recovered EF s/p CRT-D, hypothyroidism, COPD, hiatal hernia, chronic abdominal pain with bleaching, who is admitted for poor PO intake, abdominal pain, early satiety, blenching. She has longstanding hx of cardiac and GI complaints,  follows at [facility] records for detail. Cardiology is following for CHF, initially consulted for pre EGD risk evaluation which is not recommended by GI.   Assessment & Plan    Chronic diastolic heart failure  Ischemic cardiomyopathy with recovered LVEF Severe mitral regurgitation, mild mitral stenosis  S/p CRT-D  - follow at [facility] Dr Mina Marble, last seen 12/29/21, felt very high risk for mitral valve replacement surgery. Due to concomitant mitral stenosis, transcatheter edge-to-edge repair was felt not an appropriate option; she was medically managed for cardiac issues.  - presented with acute on chronic GI symptoms including poor PO intake, abdominal pain, early satiety, blenching; had interval chest pain and SOB complaints as well  - Echo 10/2020 as above at [facility]  - GI consult noted, from [place] previous workup she was found to have small hiatal hernia and mild distal esophageal stricture. CTAP this admission reviewed by GI, gastric diverticulum and short segment of intussusception of the duodenum, no obstruction, unable rule out small lesion,  query relative mesenteric  insufficiency in setting of congestive heart failure - BNP 1839 POA, proBNP 2756 on 11/25/21 at [place] showed mild to moderate severity interstitial edema , POA - she appears euvolemic on exam today, would transition IV lasix to PO today, on PTA lasix 20mg /40mg  alternating dosing daily, would resume home diuresis regimen today  - GDMT: continue/resume  PTA metoprolol 25mg  BID, lisinopril 5mg , spironolactone 12.5mg  , jardiance 10mg   - discussed realistic goal for CHF and MR management, she seems reasonable, consider ongoing goals of care discussion   CAD with hx of stents and CABG  - no chest pain now, had reported chest pain but seems difficult to distinguish from GI symptoms - Hs trop flat - EKG non-diagnostics  - no ACS evidence - continue medical therapy with metoprolol, statin, no ASA due to lose dose Eliquis   Paroxysmal atrial fibrillation - continue metoprolol and eliquis  - paced        For questions or updates, please contact [place] Please consult [email or web address] for contact info under        Signed, Margie Billet, NP  04/28/2022, 9:37 AM

## 2022-04-28 NOTE — Progress Notes (Signed)
Initial Nutrition Assessment  DOCUMENTATION CODES:   Severe malnutrition in context of chronic illness, Underweight  INTERVENTION:  Boost Breeze po TID, each supplement provides 250 kcal and 9 grams of protein MVI with minerals daily "High calorie, high protein nutrition therapy" handout discussed and added to AVS  NUTRITION DIAGNOSIS:   Severe Malnutrition related to chronic illness as evidenced by severe fat depletion, severe muscle depletion, energy intake < or equal to 75% for > or equal to 1 month.  GOAL:   Patient will meet greater than or equal to 90% of their needs  MONITOR:   PO intake, Diet advancement, Labs, Weight trends  REASON FOR ASSESSMENT:   Consult Other (Comment) (nutrition goals)  ASSESSMENT:   Pt admitted with abdominal pain. PMH significant for HTN, HLD, COPD, CAD s/p stents, afib on Eliquis, pacemaker, AICD placement and h/o chronic diastolic CHF.  GI following and has been seen OP. Pt with episodes of belching and burping. OP workup with findings of small hiatal hernia and mild distal esophageal stricture.   CT abdomen was concerning for possible gastric ulceration, jejunal intussusception, seen by gastroenterology, and general surgery, supportive care recommended, upper GI series ordered. Per GI, continue CLD. If upper GI negative may consider SLP evaluation.   Pt laying comfortably in bed. Her daughter present at bedside. They went to [employer] for pt's birthday last week. She states that she has chronically had a poor appetite but has declined more significantly within the last 2-3 weeks recalling she can eat a "thimble size" amount and feels full.   Tried an Ensure but she was uncomfortably full afterward. She is agreeable to trying [employer].   Her daughter reports that she weighed 100 lbs in March 2023. She now remains around 85-90 lbs.  Unfortunately per review of chart, there is limited documentation of weight history on file to review.  Pt's current weight is 41.3 kg (91 lbs). Will continue to monitor throughout admission.    Medications: coalce, lasix every other day, protonix, senna  Labs:  Cr 1.34, GFR 38  UOP: 278ml x24 hours I/O's: +960ml since admit  NUTRITION - FOCUSED PHYSICAL EXAM:  Flowsheet Row Most Recent Value  Orbital Region Severe depletion  Upper Arm Region Severe depletion  Thoracic and Lumbar Region Severe depletion  Buccal Region Severe depletion  Temple Region Severe depletion  Clavicle Bone Region Severe depletion  Clavicle and Acromion Bone Region Severe depletion  Scapular Bone Region Severe depletion  Dorsal Hand Severe depletion  Patellar Region Severe depletion  Anterior Thigh Region Severe depletion  Posterior Calf Region Severe depletion  Edema (RD Assessment) None  Hair Reviewed  Eyes Reviewed  Mouth Reviewed  Skin Reviewed  Nails Reviewed       Diet Order:   Diet Order             Diet clear liquid Room service appropriate? Yes; Fluid consistency: Thin  Diet effective now                   EDUCATION NEEDS:   Education needs have been addressed  Skin:  Skin Assessment: Reviewed RN Assessment  Last BM:  3/18  Height:   Ht Readings from Last 1 Encounters:  04/28/22 5\' 1"  (1.549 m)    Weight:   Wt Readings from Last 1 Encounters:  04/28/22 41.3 kg   BMI:  Body mass index is 17.2 kg/m.  Estimated Nutritional Needs:   Kcal:  1300-1500  Protein:  65-80g  Fluid:  1.3-1.5L  Clayborne Dana, RDN, LDN Clinical Nutrition

## 2022-04-28 NOTE — Progress Notes (Signed)
Heart Failure Navigator Progress Note  Assessed for Heart & Vascular TOC clinic readiness.  Patient normal EF, No TOC per Dr. Broadus John, has Palliative care, possible home with Hospice. .   Navigator will sign off at this time.   Earnestine Leys, BSN, Clinical cytogeneticist Only

## 2022-04-28 NOTE — Consult Note (Signed)
Consultation Note Date: 04/28/2022   Patient Name: Jane [name]  DOB: 04/03/1933  MRN: [id number]  Age / Sex: 89 y.o., female  PCP: System, Provider Not In Referring Physician: Domenic Polite, MD  Reason for Consultation: Establishing goals of care  HPI/Patient Profile: 89 y.o. female  with past medical history of COPD on home O2, CAD s/p 7 stents, chronic diastolic CHF, severe mitral regurgitation not a candidate for mitral clip, paroxysmal A-fib on Eliquis, pacemaker/ICD, and hiatal hernia admitted on 04/26/2022 with increased shortness of breath, nausea, belching, and poor appetite.  Patient is being treated for acute on chronic diastolic CHF.  Patient also with severe MR. Question gastric diverticulum versus malignancy and jejunal intussusception on CT abdomen.  PMT consulted to discuss goals of care given patient's progressive decline, frailty, multiple medical problems and advanced age.  Clinical Assessment and Goals of Care: I have reviewed medical records including EPIC notes, labs and imaging, assessed the patient and then met with patient and her daughter Jane [name] to discuss diagnosis prognosis, GOC, EOL wishes, disposition and options.  I introduced Palliative Medicine as specialized medical care for people living with serious illness. It focuses on providing relief from the symptoms and stress of a serious illness. The goal is to improve quality of life for both the patient and the family.  We discussed a brief life review of the patient.  They tell me patient worked until she was 89 years old.  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  They describe her as strong-willed.  Patient has 2 daughters.  As far as functional and nutritional status daughter speaks of some decline over the past few weeks-requiring more assistance.  She tells me of some weakness and shortness of breath limiting mobility over the past 6 months.  She also tells me  of poor appetite and a 10 to 15 pound weight loss in the past year.  Daughter also shares of some mild cognitive impairment at home.   We discussed patient's current illness and what it means in the larger context of patient's on-going co-morbidities.  Natural disease trajectory and expectations at EOL were discussed.  We reviewed her heart and GI issues.  We discussed how her frailty affects her diagnoses.  We discussed that she is not a candidate for aggressive medical interventions.  I attempted to elicit values and goals of care important to the patient.    The difference between aggressive medical intervention and comfort care was considered in light of the patient's goals of care.   We discussed CODE STATUS at length.  Discussed the differences between full code and DNR.  We discussed evidenced based poor outcomes in similar hospitalized patients, as the cause of the arrest is likely associated with chronic/terminal disease rather than a reversible acute cardio-pulmonary event.  Patient is uncertain about CODE STATUS and request time to consider.   Discussed with family and patient the importance of continued conversation with family and the medical providers regarding overall plan of care and treatment options, ensuring decisions are within the context of the patients values and GOCs.    Hospice and Palliative Care services outpatient were explained and offered.  Discussed going home with home health versus hospice care.  Again, they request time to consider.  Questions and concerns were addressed. The family was encouraged to call with questions or concerns.  Primary Decision Maker PATIENT?  Mild cognitive impairment noted question if patient has the capacity to make medical decisions independently.  Daughters  are next of kin and involved in care.  At this time daughters defer to patient for ultimate decision making    SUMMARY OF RECOMMENDATIONS   CODE STATUS discussed, patient  considering Discussed going home with home health versus home hospice -patient considering Will follow-up 3/20  Code Status/Advance Care Planning: Full code      Primary Diagnoses: Present on Admission:  Acute on chronic systolic (congestive) heart failure ([place])   I have reviewed the medical record, interviewed the patient and family, and examined the patient. The following aspects are pertinent.  Past Medical History:  Diagnosis Date   CHF (congestive heart failure) (HCC)    COPD (chronic obstructive pulmonary disease) (HCC)    Hyperlipidemia    Hypertension    Mitral regurgitation    Pneumonia    Thyroid disease    Social History   Socioeconomic History   Marital status: Widowed    Spouse name: Not on file   Number of children: Not on file   Years of education: Not on file   Highest education level: Not on file  Occupational History   Not on file  Tobacco Use   Smoking status: Never   Smokeless tobacco: Never  Substance and Sexual Activity   Alcohol use: Not Currently   Drug use: Not Currently   Sexual activity: Not on file  Other Topics Concern   Not on file  Social History Narrative   Not on file   Social Determinants of Health   Financial Resource Strain: Not on file  Food Insecurity: Not on file  Transportation Needs: Not on file  Physical Activity: Not on file  Stress: Not on file  Social Connections: Not on file   No family history on file. Scheduled Meds:  apixaban  2.5 mg Oral BID   atorvastatin  20 mg Oral QHS   docusate sodium  100 mg Oral BID   feeding supplement  1 Container Oral TID BM   furosemide  20 mg Oral QODAY   [START ON 04/29/2022] furosemide  40 mg Oral QODAY   hydrocortisone   Rectal BID   methimazole  2.5 mg Oral Daily   metoprolol tartrate  25 mg Oral BID   midodrine  10 mg Oral BID WC   multivitamin with minerals  1 tablet Oral Daily   pantoprazole (PROTONIX) IV  40 mg Intravenous Q12H   senna-docusate  1 tablet Oral  Daily   sodium chloride flush  3 mL Intravenous Q12H   spironolactone  12.5 mg Oral Daily   Continuous Infusions: PRN Meds:.acetaminophen **OR** acetaminophen, bisacodyl, hydrALAZINE, morphine injection, ondansetron **OR** ondansetron (ZOFRAN) IV, polyethylene glycol, witch hazel-glycerin No Known Allergies Review of Systems  Constitutional:  Positive for activity change and appetite change.  Respiratory:  Positive for shortness of breath.   Neurological:  Positive for weakness.    Physical Exam Constitutional:      General: She is not in acute distress.    Appearance: She is ill-appearing.  Pulmonary:     Comments: Remains on nasal cannula, slightly increased respiratory rate Skin:    General: Skin is warm and dry.  Neurological:     Mental Status: She is alert.     Comments: Patient oriented to becomes confused easily during conversation and needs redirection     Vital Signs: BP 107/66 (BP Location: Right Arm)   Pulse 78   Temp 97.6 F (36.4 C) (Oral)   Resp 18   Ht 5\' 1"  (1.549 m)  Wt 41.3 kg   SpO2 97%   BMI 17.20 kg/m  Pain Scale: 0-10   Pain Score: 0-No pain   SpO2: SpO2: 97 % O2 Device:SpO2: 97 % O2 Flow Rate: .O2 Flow Rate (L/min): 3 L/min  IO: Intake/output summary:  Intake/Output Summary (Last 24 hours) at 04/28/2022 1614 Last data filed at 04/28/2022 0900 Gross per 24 hour  Intake 1559.17 ml  Output 100 ml  Net 1459.17 ml    LBM: Last BM Date : 04/27/22 Baseline Weight: Weight: 41.3 kg Most recent weight: Weight: 41.3 kg     Palliative Assessment/Data:PPS 40%     *Please note that this is a verbal dictation therefore any spelling or grammatical errors are due to the "[employer]" system interpretation.   Juel Burrow, DNP, AGNP-C Palliative Medicine Team [phone] Pager: [phone]

## 2022-04-29 ENCOUNTER — Inpatient Hospital Stay (HOSPITAL_COMMUNITY): Payer: Medicare Other

## 2022-04-29 DIAGNOSIS — R935 Abnormal findings on diagnostic imaging of other abdominal regions, including retroperitoneum: Secondary | ICD-10-CM | POA: Diagnosis not present

## 2022-04-29 DIAGNOSIS — I5023 Acute on chronic systolic (congestive) heart failure: Secondary | ICD-10-CM | POA: Diagnosis not present

## 2022-04-29 DIAGNOSIS — K449 Diaphragmatic hernia without obstruction or gangrene: Secondary | ICD-10-CM | POA: Diagnosis not present

## 2022-04-29 DIAGNOSIS — Z515 Encounter for palliative care: Secondary | ICD-10-CM | POA: Diagnosis not present

## 2022-04-29 DIAGNOSIS — I5042 Chronic combined systolic (congestive) and diastolic (congestive) heart failure: Secondary | ICD-10-CM

## 2022-04-29 DIAGNOSIS — R627 Adult failure to thrive: Secondary | ICD-10-CM

## 2022-04-29 DIAGNOSIS — I340 Nonrheumatic mitral (valve) insufficiency: Secondary | ICD-10-CM | POA: Diagnosis not present

## 2022-04-29 DIAGNOSIS — Z7189 Other specified counseling: Secondary | ICD-10-CM | POA: Diagnosis not present

## 2022-04-29 DIAGNOSIS — E785 Hyperlipidemia, unspecified: Secondary | ICD-10-CM | POA: Diagnosis not present

## 2022-04-29 DIAGNOSIS — D509 Iron deficiency anemia, unspecified: Secondary | ICD-10-CM

## 2022-04-29 DIAGNOSIS — E43 Unspecified severe protein-calorie malnutrition: Secondary | ICD-10-CM

## 2022-04-29 DIAGNOSIS — R109 Unspecified abdominal pain: Secondary | ICD-10-CM | POA: Diagnosis not present

## 2022-04-29 DIAGNOSIS — I5031 Acute diastolic (congestive) heart failure: Secondary | ICD-10-CM

## 2022-04-29 DIAGNOSIS — I482 Chronic atrial fibrillation, unspecified: Secondary | ICD-10-CM

## 2022-04-29 LAB — URINE CULTURE: Culture: 30000 — AB

## 2022-04-29 LAB — ECHOCARDIOGRAM COMPLETE
AV Mean grad: 10 mmHg
AV Peak grad: 18 mmHg
AV Vena cont: 0.2 cm
Ao pk vel: 2.12 m/s
Area-P 1/2: 4.49 cm2
Height: 61 in
MV M vel: 6.01 m/s
MV Peak grad: 144.5 mmHg
P 1/2 time: 498 msec
Radius: 0.8 cm
S' Lateral: 2.4 cm
Weight: 1420.8 oz

## 2022-04-29 MED ORDER — PANTOPRAZOLE SODIUM 40 MG PO TBEC
40.0000 mg | DELAYED_RELEASE_TABLET | Freq: Every day | ORAL | Status: DC
  Administered 2022-04-30 – 2022-05-02 (×3): 40 mg via ORAL
  Filled 2022-04-29 (×3): qty 1

## 2022-04-29 MED ORDER — SUCRALFATE 1 GM/10ML PO SUSP
1.0000 g | Freq: Three times a day (TID) | ORAL | Status: DC
  Administered 2022-04-29 – 2022-05-02 (×12): 1 g via ORAL
  Filled 2022-04-29 (×12): qty 10

## 2022-04-29 NOTE — Assessment & Plan Note (Signed)
Hyperthyroidism  Continue with methimazole.  

## 2022-04-29 NOTE — IPAL (Signed)
I spoke with patient and her daughter at the bedside about patient's condition of advance heart failure and severe valvular disease. Limited pharmacologic treatment and not candidate for invasive interventions.  They have decided to change code status to DNR.  Patient will qualify for hospice services at the time of her discharge.  All questions have been addressed.

## 2022-04-29 NOTE — Progress Notes (Signed)
Progress Note   Patient: Jane [name] [id number] DOB: [birth year] DOA: 04/26/2022     2 DOS: the patient was seen and examined on 04/29/2022   Brief hospital course: Mrs. Walpole was admitted to the hospital with the working diagnosis of decompensated heart failure.   89 yo female with the past medical history of hypertension, COPD, coronary artery disease, atrial fibrillation and heart failure who presented with abdominal pain. Reported severe intermittent abdominal pain. She was evaluated at [facility] with evidence of gastric ulceration and possible jejunal intussusception. At home patient had lower extremity edema and dyspnea for several days. On her initial physical examination blood pressure 100/52, HR 74, RR 24 and 02 saturation 100%, lungs with no wheezing or rales, heart with S1 and S2 present and rhythmic, abdomen with no distention and positive lower extremity edema.   Surgery was consulted and recommended medical therapy.   Placed on IV furosemide for diuresis.   Chest radiograph with cardiomegaly, bilateral hilar vascular congestion. Positive pacemaker defibrillator in place with one atrial lead and 2 ventricular leads.   EKG 78 bpm, normal axis, qtc 511, atrial sensing and pacing, ventricular pacing, one PAC, no significant ST segment or T wave changes.   Upper GI series negative for acute pathology or intussusception.   Patient with poor prognosis, advance heart failure and valvular heart disease.  Code status changed to DNR, patient and her family are interested in hospice care.   Assessment and Plan: * Acute on chronic systolic (congestive) heart failure (HCC) Echocardiogram with preserved LV systolic function with EF 60 to 123456, RV systolic function is preserved, LA with severe dilatation, severe mitral valve regurgitation, with possible mitral valve stenosis, moderate to severe TR, mild aortic valve stenosis.   Not documented urine output.  Patient with positive dyspnea and  JVD.   Plan to continue medical care with furosemide, spironolactone, and metoprolol.  Blood pressure support with midodrine.  Patient with advanced heart failure and poor prognosis, not candidate for valve repair.  May need IV furosemide.    Hiatal hernia Positive dyspepsia, Continue with antiacid therapy, transition to po. Add sucralfate.  Small portions and seating up right for meals.  Pain control with morphine.   Anemia, iron deficiency Continue close follow up on Hgb and hct.   Hyperthyroidism Continue with methimazole.   Hyperlipidemia Continue with atorvastatin.   Protein-calorie malnutrition, severe Continue with nutritional supplements.   Atrial fibrillation, chronic ([place]) Patient on paced rhythm. Continue rate control with metoprolol.  Anticoagulation with apixaban.         Subjective: Patient with difficulty swallowing, positive dyspnea and congestion, very weak and deconditioned.   Physical Exam: Vitals:   04/29/22 0355 04/29/22 0455 04/29/22 0748 04/29/22 1205  BP: 115/64 127/79 (!) 112/54 105/70  Pulse: 73 86 74 75  Resp: 18 18 20 20   Temp: 97.7 F (36.5 C) 97.7 F (36.5 C) (!) 97 F (36.1 C) (!) 97.5 F (36.4 C)  TempSrc: Oral Oral Axillary Axillary  SpO2: 94% 90% 92% 90%  Weight:  40.3 kg    Height:       Neurology awake and alert ENT with mild pallor. No icterus Cardiovascular with S1 and S2 present and regular with positive systolic murmur at the apex, with no gallops or rubs Positive JVD No lower extremity edema  Respiratory with mild rales at bases with no wheezing or rhonchi Abdomen with mild distention, but not tender  Data Reviewed: 3   Family Communication: I spoke with  patient's daughter at the bedside, we talked in detail about patient's condition, plan of care and prognosis and all questions were addressed.   Disposition: Status is: Inpatient Remains inpatient appropriate because: heart failure medical management    Planned Discharge Destination: Home  Author: Tawni Millers, MD 04/29/2022 2:39 PM  For on call review [email or web address].

## 2022-04-29 NOTE — Assessment & Plan Note (Signed)
Continue with nutritional supplements.  

## 2022-04-29 NOTE — Progress Notes (Addendum)
Progress Note  Patient Name: Jane [name] Date of Encounter: 04/29/2022  Primary Cardiologist: Jarrett Soho  Subjective   Still SOB with mild ambulation in room. Daughter notes some correlation between patient becoming anxious and having belching. Overall improved since admission but not back to baseline.  Inpatient Medications    Scheduled Meds:  apixaban  2.5 mg Oral BID   atorvastatin  20 mg Oral QHS   docusate sodium  100 mg Oral BID   feeding supplement  1 Container Oral TID BM   furosemide  20 mg Oral QODAY   furosemide  40 mg Oral QODAY   hydrocortisone   Rectal BID   methimazole  2.5 mg Oral Daily   metoprolol tartrate  25 mg Oral BID   midodrine  10 mg Oral BID WC   multivitamin with minerals  1 tablet Oral Daily   pantoprazole (PROTONIX) IV  40 mg Intravenous Q12H   senna-docusate  1 tablet Oral Daily   sodium chloride flush  3 mL Intravenous Q12H   spironolactone  12.5 mg Oral Daily   Continuous Infusions:  PRN Meds: acetaminophen **OR** acetaminophen, bisacodyl, hydrALAZINE, morphine injection, ondansetron **OR** ondansetron (ZOFRAN) IV, polyethylene glycol, witch hazel-glycerin   Vital Signs    Vitals:   04/29/22 0010 04/29/22 0355 04/29/22 0455 04/29/22 0748  BP: 105/67 115/64 127/79 (!) 112/54  Pulse: 73 73 86 74  Resp: 20 18 18 20   Temp: 97.7 F (36.5 C) 97.7 F (36.5 C) 97.7 F (36.5 C) (!) 97 F (36.1 C)  TempSrc: Oral Oral Oral Axillary  SpO2: 99% 94% 90% 92%  Weight: 40.4 kg  40.3 kg   Height:       No intake or output data in the 24 hours ending 04/29/22 1018    04/29/2022    4:55 AM 04/29/2022   12:10 AM 04/28/2022    3:44 AM  Last 3 Weights  Weight (lbs) 88 lb 12.8 oz 89 lb 91 lb 0.8 oz  Weight (kg) 40.279 kg 40.37 kg 41.3 kg     Telemetry    NSR, atrial sensed V paced as well as occasional AV pacing. 4 beats NSVT - Personally Reviewed  Physical Exam   GEN: No acute distress. Very frail appearing HEENT: Normocephalic,  atraumatic, sclera non-icteric. Neck: Elevated JVD Cardiac: RRR, machinery like murmur best heard at LSB but auscultated over entire precordium. No rubs or gallops.  Respiratory: Decreased BS at bases otherwise no wheezing, rales or rhonchi. Breathing is unlabored. GI: Soft, nontender, non-distended, BS +x 4. MS: no deformity. Extremities: No clubbing or cyanosis. No edema. Distal pedal pulses are 2+ and equal bilaterally. Neuro:  AAOx3. Follows commands. Psych:  Responds to questions appropriately with a normal affect.  Labs    High Sensitivity Troponin:   Recent Labs  Lab 04/26/22 2241 04/27/22 0202  TROPONINIHS 38* 32*      Cardiac EnzymesNo results for input(s): "TROPONINI" in the last 168 hours. No results for input(s): "TROPIPOC" in the last 168 hours.   Chemistry Recent Labs  Lab 04/26/22 2122 04/28/22 0021  NA 141 144  K 3.2* 3.7  CL 108 108  CO2 23 26  GLUCOSE 118* 86  BUN 36* 31*  CREATININE 1.47* 1.34*  CALCIUM 9.1 8.8*  PROT 7.1  --   ALBUMIN 3.4*  --   AST 16  --   ALT 15  --   ALKPHOS 65  --   BILITOT 1.1  --   GFRNONAA 34* 38*  ANIONGAP 10 10     Hematology Recent Labs  Lab 04/26/22 2122 04/28/22 0021  WBC 10.0 9.6  RBC 4.24 3.62*  HGB 13.7 11.8*  HCT 44.4 38.7  MCV 104.7* 106.9*  MCH 32.3 32.6  MCHC 30.9 30.5  RDW 16.1* 15.8*  PLT 185 190    BNP Recent Labs  Lab 04/26/22 2241  BNP 1,839.9*     DDimer No results for input(s): "DDIMER" in the last 168 hours.   Radiology    DG Abd 2 Views  Result Date: 04/28/2022 CLINICAL DATA:  89 year old female with evidence of gastric ulcer on CT Abdomen and Pelvis. EXAM: ABDOMEN - 2 VIEW COMPARISON:  CT Abdomen and Pelvis 04/26/2022. FINDINGS: Upright and supine views of the abdomen at 0619 hours. Cardiomegaly, cardiac AICD, lung base effusions and patchy opacity redemonstrated. Extensive Aortoiliac calcified atherosclerosis. Paucity of bowel gas in the upper abdomen. Normal gas containing  cecum in the right lower quadrant. No pneumoperitoneum identified. Stable cholecystectomy clips. Excreted IV contrast in the bladder. Stable visualized osseous structures. IMPRESSION: 1. No pneumoperitoneum identified. Paucity of bowel gas in the upper abdomen, nonobstructed pattern elsewhere. 2. Excreted IV contrast in the bladder. 3. Ongoing lung base pleural effusions, patchy opacity. 4.  Aortic Atherosclerosis (ICD10-I70.0). Electronically Signed   By: Genevie Ann M.D.   On: 04/28/2022 08:25    Cardiac Studies   2d echo in process  [facility] 04/2020    NORMAL LEFT VENTRICULAR SYSTOLIC FUNCTION    NORMAL RIGHT VENTRICULAR SYSTOLIC FUNCTION    VALVULAR REGURGITATION: TRIVIAL AR, SEVERE MR, TRIVIAL PR, MILD TR    VALVULAR STENOSIS: MODERATE MS    SEVERE MR (EROA 0.2cm^2, RV 81mL, MR RADIUS 0.8cm, VC 0.4cm)    AORTIC SCLEROSIS WITHOUT STENOSIS    3D acquisition and reconstructions were performed as part of this    examination to more accurately quantify the effects of moderate or greater    valve regurgitation. (post-processing on an Independent workstation).     Compared with prior Echo study on 05/07/2017: NO SIGNIFICANT CHANGE   Patient Profile     89 y.o. female with CAD s/p MI and CABG/multiple PCI's, HLD, CKD 3b by labs (Cr 1.5 in 12/2021), severe mitral valve regurgitation not amenable to TEER due to mitral stenosis, paroxysmal afib, ischemic cardiomyopathy with recovered EF s/p CRT-D, hyperthyroidism, COPD (intended for 2L at home but pt declined), hiatal hernia, chronic abdominal pain with belching, who is admitted for poor PO intake, abdominal pain, early satiety, blenching. She has longstanding hx of cardiac and GI complaints, follows at [facility] records for detail. Found to have abnormal imaging with question gastric diverticulum versus malignancy and jejunal intussusception on CT abdomen, large renal cystic mass noted. Cardiology is following for CHF, initially consulted for pre EGD risk  evaluation which is not recommended by GI.   Assessment & Plan    1. Abdominal pain, early satiety, abnormal imaging - question gastric diverticulum versus malignancy and jejunal intussusception on CT abdomen yesterday - also large left renal cystic mass noted, workup for this deferred as inpatient - per IM, GI, palliative - UGI pending today  2. Acute on chronic HFpEF, HTN, CKD 3b - transitioned to oral diuretic yesterday 40mg  daily alternating with 20mg  daily though still somewhat tachypneic on exam - overall feeling a little better though,  - also on spironolactone 12.5mg  daily, metoprolol tartrate 25mg  BID (home meds), and new midodrine 10mg  BIDWC given tendency for softer BP - did not yet have labs today,  will order for AM - baseline Cr appears around 1.5 - 2D echo pending - very frail appearing, agree with palliative following  3. Severe MR - last echo 2022 EF, severe MR, trivial PR, mild TR, moderate MS - not a candidate for TEER  4. CAD, HLD - minimal troponin elevation not felt to be due to ACS, low suspicion for cardiac etiology - no ASA due to concomitant Eliquis - continued on home atorvastatin  5. PAF - AV pacing and occasional atrial sensed V pacing on telemetry - on Eliquis at reduced dose with weight/age - on metoprolol 25mg  BID  6. H/o CRT-D - followed at [facility] - if goals of care change and she elects DNR, would need to discuss device settings   For questions or updates, please contact [place] Please consult [email or web address] for contact info under Cardiology/STEMI.  Signed, Charlie Pitter, PA-C 04/29/2022, 10:18 AM

## 2022-04-29 NOTE — Assessment & Plan Note (Addendum)
Echocardiogram with preserved LV systolic function with EF 60 to 123456, RV systolic function is preserved, LA with severe dilatation, severe mitral valve regurgitation, with possible mitral valve stenosis, moderate to severe TR, mild aortic valve stenosis.   Patient was placed on furosemide for diuresis, negative fluid balance was achieved, with improvement in her symptoms.   Plan to continue medical care with furosemide, spironolactone, and metoprolol.  Blood pressure support with midodrine.  Patient with advanced heart failure and poor prognosis, not candidate for valve repair.   Plan to follow up as outpatient with hospice services.   Continue supplemental 02 per [place].

## 2022-04-29 NOTE — Progress Notes (Signed)
OT Cancellation Note  Patient Details Name: Jane [name] MRN: [id number] DOB: Feb 05, 1934   Cancelled Treatment:    Reason Eval/Treat Not Completed: Other (comment) (per RN pt has had O2 off at her request, RN reporting pt pale and SOB, now recovering on O2. OT will follow up for OT tx as appropriate.)  Jane [name], OTD, OTR/L [facility] [phone]   Samar Dass K Koonce 04/29/2022, 1:20 PM

## 2022-04-29 NOTE — Assessment & Plan Note (Addendum)
Positive dyspepsia,  Patient had radiographic abdominal series with oral barium, resulted with small hiatal hernia. Antral and duodenal abnormality suggested by CT were negative.   Continue with antiacid therapy. Small portions and seating up right for meals.  Pain control with morphine.

## 2022-04-29 NOTE — Assessment & Plan Note (Signed)
Continue with atorvastatin 

## 2022-04-29 NOTE — Hospital Course (Addendum)
Jane [name] was admitted to the hospital with the working diagnosis of decompensated heart failure.   89 yo female with the past medical history of hypertension, COPD, coronary artery disease, atrial fibrillation and heart failure who presented with abdominal pain. Reported severe intermittent abdominal pain. She was evaluated at [facility] CT with evidence of gastric ulceration and possible jejunal intussusception. At home patient had lower extremity edema and dyspnea for several days. On her initial physical examination blood pressure 100/52, HR 74, RR 24 and 02 saturation 100%, lungs with no wheezing or rales, heart with S1 and S2 present and rhythmic, abdomen with no distention and positive lower extremity edema.   Na 141, K 3,2 CL 108, bicarbonate 23, glucose 118, bun 36 cr 1,47.  BNP 1,839 High sensitive troponin 38 and 32  Lactic acid 1,0  Wbc 10,9 hgb 13,7 plt 185   Urine analysis with SG 1,010, 6-10 wbc, negative protein.  Urine culture 30,000 CFU enterococcus faecalis.   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 Short segment intussusception involving the proximal duodenum.  Large lobulated multi septated exophytic left renal cyst. (Recommended follow up with non emergent [place]).   Chest radiograph with cardiomegaly, bilateral hilar vascular congestion, bilateral interstitial infiltrates, pacemaker defibrillator in place, with one atrial lead and biventricular leads. Sternotomy wires in place.   EKG 78 bpm, normal axis, left bundle branch block, qtc 511 atrial sensing and ventricular pacing with no significant ST segment or T wave changes.   Surgery was consulted and recommended medical therapy, along with GI consultation.    Placed on IV furosemide for diuresis.   Upper GI series negative for acute pathology or intussusception.   Patient with poor prognosis, advance heart failure and valvular heart disease.  Code status changed to DNR, patient and  her family are interested in hospice care.  03/22 volume status has improved. Patient will follow as outpatient with hospice services.

## 2022-04-29 NOTE — Progress Notes (Addendum)
Progress Note  Primary GI: Dr.Wild/ [facility]     Subjective  Chief Complaint: Acute abdominal pain, abnormal CT imaging   Patient with family at bedside, daughter. Provided some of the history.  Patient has been on liquid diet. Has barium swallow this AM with fatigue and some nausea.  Had BM small amount just now with some SOB afterwards.    Objective   Vital signs in last 24 hours: Temp:  [97 F (36.1 C)-97.7 F (36.5 C)] 97 F (36.1 C) (03/20 0748) Pulse Rate:  [73-86] 74 (03/20 0748) Resp:  [18-20] 20 (03/20 0748) BP: (105-127)/(54-79) 112/54 (03/20 0748) SpO2:  [90 %-99 %] 92 % (03/20 0748) Weight:  [40.3 kg-40.4 kg] 40.3 kg (03/20 0455) Last BM Date : 04/28/22 Last BM recorded by nurses in past 5 days No data recorded  General:   Frail, elderly appearing female no discomfort. Heart: Regular rate and rhythm loud systolic murmur Pulm: Decreased breath sounds bilateral bases Abdomen:  Soft, Non-distended AB, Active bowel sounds. No tenderness ., No organomegaly appreciated. Extremities:  with 1+ edema. Neurologic:  Alert and  oriented x4;  No focal deficits.  Psych:  Cooperative. Normal mood and affect.  Intake/Output from previous day: 03/19 0701 - 03/20 0700 In: 180 [P.O.:180] Out: -  Intake/Output this shift: No intake/output data recorded.  Studies/Results: DG UGI W SINGLE CM (SOL OR THIN BA)  Result Date: 04/29/2022 CLINICAL DATA:  Question gastric and duodenal abnormalities based on CT reading. EXAM: DG UGI W SINGLE CM TECHNIQUE: Single contrast examination was then performed using thin liquid barium. This exam was performed by Brynda Greathouse PA-C, and was supervised and interpreted by Dr. Maree Erie. FLUOROSCOPY: Radiation Exposure Index (as provided by the fluoroscopic device): 1 minute 42 seconds 358.04 micro gray meter squared COMPARISON:  CT 04/26/2022 FINDINGS: Esophagus:  Normal appearance. Esophageal motility: Tertiary contractions observed leading to mild  dysmotility with incomplete primary stripping wave with retrograde flow of barium bolus. Gastroesophageal reflux:  None visualized. Ingested 42mm barium tablet: Not given Stomach: Small hiatal hernia. No evidence of antral deformity. No ulceration or diverticulum. Gastric emptying: Normal. Duodenum: Normal appearance. No evidence of duodenal bulb pathology. No sign of intussusception. Other: Limited exam due to patient frailty, immobility, and clinical status. IMPRESSION: 1.  Small hiatal hernia 2.  Tertiary contractions leading to mild esophageal dysmotility. Otherwise normal examination. Antral and duodenal abnormality suggested by CT are not present. Electronically Signed   By: Nelson Chimes M.D.   On: 04/29/2022 11:24   ECHOCARDIOGRAM COMPLETE  Result Date: 04/29/2022    ECHOCARDIOGRAM REPORT   Patient Name:   Jane [name] Date of Exam: 04/29/2022 Medical Rec #:  [id number]    Height:       61.0 in Accession #:    [id number]   Weight:       88.8 lb Date of Birth:  1933/02/27    BSA:          1.338 m Patient Age:    89 years     BP:           118/72 mmHg Patient Gender: F            HR:           75 bpm. Exam Location:  Inpatient Procedure: 2D Echo, Cardiac Doppler and Color Doppler Indications:    I50.31 CHF  History:        Patient has no prior history of Echocardiogram examinations and  Patient has prior history of Echocardiogram examinations, most                 recent 11/06/2020. Cardiomyopathy, Mitral Valve Disease,                 Signs/Symptoms:Shortness of Breath; Risk Factors:Non-Smoker,                 Hypertension and Dyslipidemia.  Sonographer:    Wilkie Aye RVT RCS Referring Phys: [place]  1. Left ventricular ejection fraction, by estimation, is 60 to 65%. The left ventricle has normal function. The left ventricle has no regional wall motion abnormalities. Left ventricular diastolic parameters are consistent with Grade II diastolic dysfunction  (pseudonormalization).  2. Right ventricular systolic function is normal. The right ventricular size is normal.  3. Left atrial size was severely dilated.  4. Right atrial size was mildly dilated.  5. The mitral valve is degenerative. Severe mitral valve regurgitation with posterior leaflet restriction. Moderate mitral annular and valvular calcification. I suspect that there is at least mild mitral stenosis but I cannot measure mean valve gradient  from this study.  6. The tricuspid valve is abnormal. Tricuspid valve regurgitation is moderate to severe.  7. The aortic valve is tricuspid. There is moderate calcification of the aortic valve. Aortic valve regurgitation is trivial. Mild aortic valve stenosis. Aortic valve mean gradient measures 10.0 mmHg.  8. The inferior vena cava is dilated in size with <50% respiratory variability, suggesting right atrial pressure of 15 mmHg. FINDINGS  Left Ventricle: Left ventricular ejection fraction, by estimation, is 60 to 65%. The left ventricle has normal function. The left ventricle has no regional wall motion abnormalities. The left ventricular internal cavity size was normal in size. There is  no left ventricular hypertrophy. Left ventricular diastolic parameters are consistent with Grade II diastolic dysfunction (pseudonormalization). Right Ventricle: The right ventricular size is normal. No increase in right ventricular wall thickness. Right ventricular systolic function is normal. Left Atrium: Left atrial size was severely dilated. Right Atrium: Right atrial size was mildly dilated. Pericardium: There is no evidence of pericardial effusion. Mitral Valve: The mitral valve is degenerative in appearance. There is moderate calcification of the mitral valve leaflet(s). Moderate mitral annular calcification. Severe mitral valve regurgitation. Tricuspid Valve: The tricuspid valve is abnormal. Tricuspid valve regurgitation is moderate to severe. Aortic Valve: The aortic valve is  tricuspid. There is moderate calcification of the aortic valve. Aortic valve regurgitation is trivial. Aortic regurgitation PHT measures 498 msec. Mild aortic stenosis is present. Aortic valve mean gradient measures 10.0  mmHg. Aortic valve peak gradient measures 18.0 mmHg. Pulmonic Valve: The pulmonic valve was normal in structure. Pulmonic valve regurgitation is trivial. Aorta: The aortic root is normal in size and structure. Venous: The inferior vena cava is dilated in size with less than 50% respiratory variability, suggesting right atrial pressure of 15 mmHg. IAS/Shunts: No atrial level shunt detected by color flow Doppler. Additional Comments: A device lead is visualized in the right ventricle.  LEFT VENTRICLE PLAX 2D LVIDd:         4.50 cm LVIDs:         2.40 cm LV PW:         0.90 cm LV IVS:        0.80 cm LVOT diam:     1.70 cm LVOT Area:     2.27 cm  RIGHT VENTRICLE  IVC RV Basal diam:  3.90 cm     IVC diam: 2.10 cm RV S prime:     10.90 cm/s TAPSE (M-mode): 1.6 cm LEFT ATRIUM              Index         RIGHT ATRIUM           Index LA diam:        5.00 cm  3.74 cm/m    RA Area:     16.10 cm LA Vol (A2C):   180.0 ml 134.57 ml/m  RA Volume:   41.30 ml  30.88 ml/m LA Vol (A4C):   146.0 ml 109.15 ml/m LA Biplane Vol: 175.0 ml 130.84 ml/m  AORTIC VALVE                    PULMONIC VALVE AV Vmax:           212.00 cm/s  PV Vmax:          0.76 m/s AV Vmean:          142.000 cm/s PV Peak grad:     2.3 mmHg AV VTI:            0.408 m      PR End Diast Vel: 2.47 msec AV Peak Grad:      18.0 mmHg AV Mean Grad:      10.0 mmHg AI PHT:            498 msec AR Vena Contracta: 0.20 cm  AORTA Ao Root diam: 2.40 cm Ao Asc diam:  3.60 cm MITRAL VALVE                  TRICUSPID VALVE MV Area (PHT): 4.49 cm       TR Peak grad:   80.6 mmHg MV Decel Time: 169 msec       TR Vmax:        449.00 cm/s MR Peak grad:    144.5 mmHg MR Mean grad:    79.0 mmHg    SHUNTS MR Vmax:         601.00 cm/s  Systemic Diam: 1.70 cm  MR Vmean:        406.0 cm/s MR PISA:         2.55 cm MR PISA Eff ROA: 21 mm MR PISA Radius:  0.80 cm MV E velocity: 172.00 cm/s MV A velocity: 113.00 cm/s MV E/A ratio:  1.52 Dalton McleanMD Electronically signed by Franki Monte Signature Date/Time: 04/29/2022/10:27:07 AM    Final    DG Abd 2 Views  Result Date: 04/28/2022 CLINICAL DATA:  89 year old female with evidence of gastric ulcer on CT Abdomen and Pelvis. EXAM: ABDOMEN - 2 VIEW COMPARISON:  CT Abdomen and Pelvis 04/26/2022. FINDINGS: Upright and supine views of the abdomen at 0619 hours. Cardiomegaly, cardiac AICD, lung base effusions and patchy opacity redemonstrated. Extensive Aortoiliac calcified atherosclerosis. Paucity of bowel gas in the upper abdomen. Normal gas containing cecum in the right lower quadrant. No pneumoperitoneum identified. Stable cholecystectomy clips. Excreted IV contrast in the bladder. Stable visualized osseous structures. IMPRESSION: 1. No pneumoperitoneum identified. Paucity of bowel gas in the upper abdomen, nonobstructed pattern elsewhere. 2. Excreted IV contrast in the bladder. 3. Ongoing lung base pleural effusions, patchy opacity. 4.  Aortic Atherosclerosis (ICD10-I70.0). Electronically Signed   By: Genevie Ann M.D.   On: 04/28/2022 08:25    Lab Results: Recent Labs    04/26/22 2122  04/28/22 0021  WBC 10.0 9.6  HGB 13.7 11.8*  HCT 44.4 38.7  PLT 185 190   BMET Recent Labs    04/26/22 2122 04/28/22 0021  NA 141 144  K 3.2* 3.7  CL 108 108  CO2 23 26  GLUCOSE 118* 86  BUN 36* 31*  CREATININE 1.47* 1.34*  CALCIUM 9.1 8.8*   LFT Recent Labs    04/26/22 2122  PROT 7.1  ALBUMIN 3.4*  AST 16  ALT 15  ALKPHOS 65  BILITOT 1.1   PT/INR No results for input(s): "LABPROT", "INR" in the last 72 hours.   Scheduled Meds:  apixaban  2.5 mg Oral BID   atorvastatin  20 mg Oral QHS   docusate sodium  100 mg Oral BID   feeding supplement  1 Container Oral TID BM   furosemide  20 mg Oral QODAY    furosemide  40 mg Oral QODAY   hydrocortisone   Rectal BID   methimazole  2.5 mg Oral Daily   metoprolol tartrate  25 mg Oral BID   midodrine  10 mg Oral BID WC   multivitamin with minerals  1 tablet Oral Daily   pantoprazole (PROTONIX) IV  40 mg Intravenous Q12H   senna-docusate  1 tablet Oral Daily   sodium chloride flush  3 mL Intravenous Q12H   spironolactone  12.5 mg Oral Daily   Continuous Infusions:    Patient profile:   89 year old white female with history of biventricular congestive heart failure, coronary artery disease status post CABG 2016, history of atrial fibrillation, she is status post pacemaker and ICD placement, has history of severe mitral regurgitation, aortic stenosis, chronic respiratory failure on O2 2 L chronically presents with AB discomfort and beltching/burping.    Impression/Plan:   Abdominal discomfort, eructation, dyspepsia, bloating in setting of severe CHF/right sided heart failure March 2023 upper GI at [place] PPI twice daily UGI shows tertiary contractions, no abnormalities Reassurance, can consider SLP verus SIBO testing Patient not an endoscopic candidate due to multiple comorbid conditions including acute congestive heart failure/inoperable severe AS/MR ? Symptoms from severe CHF/MR No further to offer inpatient at this time, will sign off.   Possible short segment intussusception of the duodenum versus diverticulum Asymptomatic UGI without abnormalities  Acute on chronic heart failure status post AICD Continue gentle diuresis Cardiology following  A-fib On Eliquis  CAD status post PCIs and CABG Negative troponin no chest pain  Mitral valve regurgitation severe not a surgical candidate  Principal Problem:   Acute on chronic systolic (congestive) heart failure (HCC) Active Problems:   Abdominal pain   Elevated brain natriuretic peptide (BNP) level   Shortness of breath   Counseling regarding goals of care    Intussusception ([place])   Protein-calorie malnutrition, severe    LOS: 2 days   Vladimir Crofts  04/29/2022, 12:06 PM   Attending physician's note   I have taken a history, reviewed the chart and examined the patient. I performed a substantive portion of this encounter, including complete performance of at least one of the key components, in conjunction with the APP. I agree with the APP's note, impression and recommendations.   Upper GI series negative for any acute pathology or intussusception.  Upper abdominal fullness, bloating and dyspepsia secondary to decompensated right heart failure Advised patient to eat small frequent meals with high-protein and low carb or fat Avoid excessive salt Fluid management per cardiology and primary team  Continue pantoprazole daily  Do  not recommend further GI workup.  GI signing off, please call with any questions  The patient and her daughter at bedside was provided an opportunity to ask questions and all were answered. They agreed with the plan and demonstrated an understanding of the instructions.   Damaris Hippo , MD [phone]

## 2022-04-29 NOTE — Assessment & Plan Note (Signed)
Continue close follow up on Hgb and hct.

## 2022-04-29 NOTE — Progress Notes (Signed)
Daily Progress Note   Patient Name: Jane [name]       Date: 04/29/2022 DOB: 06/08/1933  Age: 89 y.o. MRN#: [id number] Attending Physician: Tawni Millers Primary Care Physician: System, Provider Not In Admit Date: 04/26/2022  Reason for Consultation/Follow-up: Establishing goals of care  Subjective: Denies complaints though she appears slightly short of breath she denies, didn't sleep great last night, some anxiety  Length of Stay: 2  Current Medications: Scheduled Meds:   apixaban  2.5 mg Oral BID   atorvastatin  20 mg Oral QHS   docusate sodium  100 mg Oral BID   feeding supplement  1 Container Oral TID BM   furosemide  20 mg Oral QODAY   furosemide  40 mg Oral QODAY   hydrocortisone   Rectal BID   methimazole  2.5 mg Oral Daily   metoprolol tartrate  25 mg Oral BID   midodrine  10 mg Oral BID WC   multivitamin with minerals  1 tablet Oral Daily   pantoprazole (PROTONIX) IV  40 mg Intravenous Q12H   senna-docusate  1 tablet Oral Daily   sodium chloride flush  3 mL Intravenous Q12H   spironolactone  12.5 mg Oral Daily    Continuous Infusions:   PRN Meds: acetaminophen **OR** acetaminophen, bisacodyl, hydrALAZINE, morphine injection, ondansetron **OR** ondansetron (ZOFRAN) IV, polyethylene glycol, witch hazel-glycerin  Physical Exam Constitutional:      General: She is not in acute distress.    Appearance: She is ill-appearing.  Pulmonary:     Comments: Slightly increased RR Skin:    General: Skin is warm and dry.  Neurological:     Comments: Somewhat confused during conversation             Vital Signs: BP 105/70 (BP Location: Left Wrist)   Pulse 75   Temp (!) 97.5 F (36.4 C) (Axillary)   Resp 20   Ht 5\' 1"  (1.549 m)   Wt 40.3 kg   SpO2 90%   BMI 16.78  kg/m  SpO2: SpO2: 90 % O2 Device: O2 Device: Room Air O2 Flow Rate: O2 Flow Rate (L/min): 3 L/min  Intake/output summary: No intake or output data in the 24 hours ending 04/29/22 1223 LBM: Last BM Date : 04/28/22 Baseline Weight: Weight: 41.3 kg Most recent weight: Weight: 40.3 kg       Palliative Assessment/Data: PPS 40%      Patient Active Problem List   Diagnosis Date Noted   Protein-calorie malnutrition, severe 04/29/2022   Intussusception ([place]) 04/28/2022   Acute on chronic systolic (congestive) heart failure ([place]) 04/27/2022   Abdominal pain 04/27/2022   Elevated brain natriuretic peptide (BNP) level 04/27/2022   Shortness of breath 04/27/2022   Counseling regarding goals of care 04/27/2022   Physical deconditioning 02/05/2015   Coronary artery disease involving coronary bypass graft of native heart with unstable angina pectoris ([place]) 02/05/2015   Other cardiac arrhythmia 02/05/2015   Constipation 02/05/2015   Hyperthyroidism 02/05/2015   Anemia, iron deficiency 02/05/2015   Situational anxiety 02/05/2015   Cardiomyopathy, ischemic 02/05/2015   Hyperlipidemia 02/05/2015    Palliative Care Assessment & Plan   HPI: 89 y.o. female  with past medical history of  COPD on home O2, CAD s/p 7 stents, chronic diastolic CHF, severe mitral regurgitation not a candidate for mitral clip, paroxysmal A-fib on Eliquis, pacemaker/ICD, and hiatal hernia admitted on 04/26/2022 with increased shortness of breath, nausea, belching, and poor appetite.  Patient is being treated for acute on chronic diastolic CHF.  Patient also with severe MR. Question gastric diverticulum versus malignancy and jejunal intussusception on CT abdomen.  PMT consulted to discuss goals of care given patient's progressive decline, frailty, multiple medical problems and advanced age.   Assessment: Met this AM with daughter Jane [name] and patient at bedside. Reviewed conversation from yesterday - all questions addressed.  They are not ready to make decisions yet. Requesting time to consider options.  Later called patient's other daughter Jane [name] per her and family's request. We also reviewed conversation from  yesterday - discussed patient's diagnoses and her frailty. Discussed concern about her path moving forward. Discussed options of home with home health vs hospice. Discussed irreversible nature of heart failure. Discussed considering ICD deactivation if we pursue comfort path. I did share with Jane [name] that based on my interactions and conversations with Jane [name] I did questions her capacity to make medical decisions independently. She expresses understanding.  All questions and concerns addressed. Time needed for family to talk amongst themselves about path moving forward. Hard Choices booklet provided to both daughters to review and consider.   Recommendations/Plan: Family/patient considering changing code status, family/patient considering home with hospice vs home health/palliative.  Discussed consideration of ICD deactivation - ongoing discussions needed  Care plan was discussed with patient, 2 daughters  Thank you for allowing the Palliative Medicine Team to assist in the care of this patient.   *Please note that this is a verbal dictation therefore any spelling or grammatical errors are due to the "[employer]" system interpretation.  Juel Burrow, DNP, [facility] Palliative Medicine Team Team Phone # [phone]  Pager [phone]

## 2022-04-29 NOTE — Progress Notes (Signed)
Patient took O2 off in the night and didn't desire to put it back on per night shift nurse. Spot checks through the morning have shown sats over 93%. The patient was pale looking and SOB this round and O2 placed back on at 2L. Patient educated on need for O2.

## 2022-04-29 NOTE — Assessment & Plan Note (Signed)
Patient on paced rhythm. Continue rate control with metoprolol.  Anticoagulation with apixaban.

## 2022-04-30 DIAGNOSIS — Z515 Encounter for palliative care: Secondary | ICD-10-CM | POA: Diagnosis not present

## 2022-04-30 DIAGNOSIS — D509 Iron deficiency anemia, unspecified: Secondary | ICD-10-CM | POA: Diagnosis not present

## 2022-04-30 DIAGNOSIS — I482 Chronic atrial fibrillation, unspecified: Secondary | ICD-10-CM | POA: Diagnosis not present

## 2022-04-30 DIAGNOSIS — K449 Diaphragmatic hernia without obstruction or gangrene: Secondary | ICD-10-CM | POA: Diagnosis not present

## 2022-04-30 DIAGNOSIS — E785 Hyperlipidemia, unspecified: Secondary | ICD-10-CM | POA: Diagnosis not present

## 2022-04-30 DIAGNOSIS — I5023 Acute on chronic systolic (congestive) heart failure: Secondary | ICD-10-CM | POA: Diagnosis not present

## 2022-04-30 DIAGNOSIS — R109 Unspecified abdominal pain: Secondary | ICD-10-CM | POA: Diagnosis not present

## 2022-04-30 DIAGNOSIS — N1832 Chronic kidney disease, stage 3b: Secondary | ICD-10-CM | POA: Diagnosis present

## 2022-04-30 DIAGNOSIS — Z7189 Other specified counseling: Secondary | ICD-10-CM | POA: Diagnosis not present

## 2022-04-30 LAB — BASIC METABOLIC PANEL
Anion gap: 7 (ref 5–15)
BUN: 35 mg/dL — ABNORMAL HIGH (ref 8–23)
CO2: 25 mmol/L (ref 22–32)
Calcium: 8.2 mg/dL — ABNORMAL LOW (ref 8.9–10.3)
Chloride: 108 mmol/L (ref 98–111)
Creatinine, Ser: 1.35 mg/dL — ABNORMAL HIGH (ref 0.44–1.00)
GFR, Estimated: 38 mL/min — ABNORMAL LOW (ref >60)
Glucose, Bld: 95 mg/dL (ref 70–99)
Potassium: 3.3 mmol/L — ABNORMAL LOW (ref 3.5–5.1)
Sodium: 140 mmol/L (ref 135–145)

## 2022-04-30 MED ORDER — POTASSIUM CHLORIDE CRYS ER 20 MEQ PO TBCR
40.0000 meq | EXTENDED_RELEASE_TABLET | Freq: Once | ORAL | Status: AC
  Administered 2022-04-30: 40 meq via ORAL
  Filled 2022-04-30: qty 2

## 2022-04-30 MED ORDER — HYDROCORTISONE ACETATE 25 MG RE SUPP
25.0000 mg | Freq: Once | RECTAL | Status: AC
  Administered 2022-04-30: 25 mg via RECTAL
  Filled 2022-04-30: qty 1

## 2022-04-30 NOTE — Assessment & Plan Note (Addendum)
Her volume status has improved, renal function is stable with serum cr at 1,36 with K at 4,5 and serum bicarbonate at 23.   Plan to continue furosemide and spironolactone.  Follow up renal function as outpatient.

## 2022-04-30 NOTE — Care Management Important Message (Signed)
Important Message  Patient Details  Name: Jane [name] MRN: [id number] Date of Birth: 08/30/1933   Medicare Important Message Given:  Yes     Shelda Altes 04/30/2022, 9:15 AM

## 2022-04-30 NOTE — Progress Notes (Addendum)
Progress Note   Patient: Jane [name] [id number] DOB: [birth year] DOA: 04/26/2022     3 DOS: the patient was seen and examined on 04/30/2022   Brief hospital course: Mrs. Kosiba was admitted to the hospital with the working diagnosis of decompensated heart failure.   89 yo female with the past medical history of hypertension, COPD, coronary artery disease, atrial fibrillation and heart failure who presented with abdominal pain. Reported severe intermittent abdominal pain. She was evaluated at [facility] CT with evidence of gastric ulceration and possible jejunal intussusception. At home patient had lower extremity edema and dyspnea for several days. On her initial physical examination blood pressure 100/52, HR 74, RR 24 and 02 saturation 100%, lungs with no wheezing or rales, heart with S1 and S2 present and rhythmic, abdomen with no distention and positive lower extremity edema.   Surgery was consulted and recommended medical therapy.   Placed on IV furosemide for diuresis.   Chest radiograph with cardiomegaly, bilateral hilar vascular congestion. Positive pacemaker defibrillator in place with one atrial lead and 2 ventricular leads.   EKG 78 bpm, normal axis, qtc 511, atrial sensing and pacing, ventricular pacing, one PAC, no significant ST segment or T wave changes.   Upper GI series negative for acute pathology or intussusception.   Patient with poor prognosis, advance heart failure and valvular heart disease.  Code status changed to DNR, patient and her family are interested in hospice care.   Assessment and Plan: * Acute on chronic systolic (congestive) heart failure (HCC) Echocardiogram with preserved LV systolic function with EF 60 to 123456, RV systolic function is preserved, LA with severe dilatation, severe mitral valve regurgitation, with possible mitral valve stenosis, moderate to severe TR, mild aortic valve stenosis.   Not documented urine output.  Patient with positive dyspnea and  JVD.   Plan to continue medical care with furosemide, spironolactone, and metoprolol.  Blood pressure support with midodrine.  Patient with advanced heart failure and poor prognosis, not candidate for valve repair.   Hiatal hernia Positive dyspepsia, Continue with antiacid therapy, transition to po. Continue with sucralfate.  Small portions and seating up right for meals.  Pain control with morphine.   Anemia, iron deficiency Continue close follow up on Hgb and hct.   Hyperthyroidism Continue with methimazole.   Hyperlipidemia Continue with atorvastatin.   Protein-calorie malnutrition, severe Continue with nutritional supplements.   Stage 3b chronic kidney disease (CKD) ([place]) Renal function with serum cr at 1,35 with K at 3,3 and serum bicarbonate at 25.  Na 140  Plan to add 40 meq kcl today and follow up renal function in am.  Continue diuresis with furosemide and spironolactone.   Atrial fibrillation, chronic ([place]) Patient on paced rhythm. Continue rate control with metoprolol.  Anticoagulation with apixaban.         Subjective: Patient is feeling better today, she was able to ambulate with assistance, no dyspnea at rest and able to eat better today.   Physical Exam: Vitals:   04/30/22 0253 04/30/22 0721 04/30/22 1053 04/30/22 1534  BP: 124/73 131/67 110/73 115/73  Pulse: 83 77 79 75  Resp: 19 20 18 17   Temp:  (!) 97.4 F (36.3 C) (!) 97.4 F (36.3 C) 97.6 F (36.4 C)  TempSrc:  Oral Oral Oral  SpO2: 92% 90% 95% 100%  Weight: 40 kg     Height:       Neurology awake and alert ENT with mild pallor Cardiovascular with S1 and S2 present, positive  murmur at the right lower sternal border and apex. Mild JVD No lower extremity edema  Respiratory with mild scattered rales with no wheezing or rhonchi Abdomen with no distention  Data Reviewed:    Family Communication: I spoke with patient's daughter at the bedside, we talked in detail about patient's  condition, plan of care and prognosis and all questions were addressed.   Disposition: Status is: Inpatient Remains inpatient appropriate because: diuresis and outpatient arrangements for home hospice   Planned Discharge Destination: Home possible discharge home tomorrow.     Author: Tawni Millers, MD 04/30/2022 3:51 PM  For on call review [email or web address].

## 2022-04-30 NOTE — TOC Progression Note (Addendum)
Transition of Care [facility]) - Progression Note    Patient Details  Name: Jane [name] MRN: [id number] Date of Birth: 07-26-1933  Transition of Care [facility]) CM/SW Contact  Zenon Mayo, RN Phone Number: 04/30/2022, 10:36 AM  Clinical Narrative:    NCM spoke with daughter Jane [name],  she would like for Cheri with [facility] to call her after 1 pm , she has an apt right now she is going to.  Daughter would like to talk to her about Home Hospice to help her decide between hospice and palliative services.  Patient lives with daughter in [place].  Cheri states she will call daughter after 1 pm today.   58- Cheri with [facility] states daughter will like to discuss with her sister and then get back with them to let them know decision.  Awaiting to hear from daughter on what they decided regarding Hospice.        Expected Discharge Plan and Services                                               Social Determinants of Health (SDOH) Interventions SDOH Screenings   Tobacco Use: Low Risk  (04/27/2022)    Readmission Risk Interventions     No data to display

## 2022-04-30 NOTE — Progress Notes (Signed)
Physical Therapy Treatment Patient Details Name: Jane [name] MRN: [id number] DOB: 1933-02-11 Today's Date: 04/30/2022   History of Present Illness Patient is a 89 y/o female who presents on 3/17 with RLQ pain and worsening dyspnea with associated edema. Found to have acute on chronic CHF.  Abdominal CT scan-evidence of gastric ulceration and possible jejunal intussusception however MD believes it is malignancy, awaiting GI consult. PMHincludes COPD, CHF, HTN, mitral regurgitation.    PT Comments    Pt received in chair, requesting assist to use toilet but then agreeable to therapy session with emphasis on transfer, gait and therapeutic exercise instruction. Pt performed seated BLE exercises with good tolerance, handout given to reinforce at end of session and reviewed with her daughter as well. Pt needing up to min guard assist for transfer/gait safety when using RW and increased assist minA for short distance gait task at bedside with HHA. Discussed DME recs with pt/daughter and supervising PT Zain B, they are agreeable to update rec to RW as she had difficulty managing the brakes on the rollator and pt agreeable to RW. Pt continues to benefit from PT services to progress toward functional mobility goals.    Recommendations for follow up therapy are one component of a multi-disciplinary discharge planning process, led by the attending physician.  Recommendations may be updated based on patient status, additional functional criteria and insurance authorization.  Follow Up Recommendations  Home health PT     Assistance Recommended at Discharge Frequent or constant Supervision/Assistance  Patient can return home with the following A little help with walking and/or transfers;A little help with bathing/dressing/bathroom;Help with stairs or ramp for entrance;Assist for transportation;Assistance with cooking/housework;Direct supervision/assist for medications management;Direct supervision/assist for  financial management   Equipment Recommendations  Rolling walker (2 wheels)    Recommendations for Other Services       Precautions / Restrictions Precautions Precautions: Fall;Other (comment) Precaution Comments: watch 02 Restrictions Weight Bearing Restrictions: No     Mobility  Bed Mobility Overal bed mobility: Modified Independent             General bed mobility comments: no assist needed, use of rails to return from sit to supine.    Transfers Overall transfer level: Needs assistance Equipment used: Rolling walker (2 wheels) Transfers: Sit to/from Stand Sit to Stand: Min guard           General transfer comment: from chair>RW and BSC<>RW and RW>EOB, pt needs reminders for safe UE placement prior to pushing from surfaces to stand.    Ambulation/Gait Ambulation/Gait assistance: Min guard, Min assist Gait Distance (Feet): 150 Feet (79ft to BSC from chair with minA HHA, then 168ft with RW and min guard) Assistive device: Rolling walker (2 wheels), 1 person hand held assist Gait Pattern/deviations: Step-through pattern, Decreased stride length, Drifts right/left Gait velocity: decreased     General Gait Details: Slow steady gait with RW, min navigational cues in hallway and for pursed-lip breathing/activity pacing. HR ~80-90's bpm with exertion, poor pleth signal on both hands and on her earlobe, however no signficant dyspnea on 4L O2 [place].   Stairs         General stair comments: pt c/o fatigue after hallway ambulation and no SpO2 reading achieved with exertion so defer stairs this date for pt safety   Wheelchair Mobility    Modified Rankin (Stroke Patients Only)       Balance Overall balance assessment: Mild deficits observed, not formally tested  Standing balance-Leahy Scale: Poor Standing balance comment: with single UE support, flexed posture in stance and pt reaching for furniture with opposite hand, much steadier and decreased  physical assist using RW                            Cognition Arousal/Alertness: Awake/alert Behavior During Therapy: WFL for tasks assessed/performed Overall Cognitive Status: Impaired/Different from baseline Area of Impairment: Memory, Following commands                     Memory: Decreased short-term memory Following Commands: Follows one step commands with increased time       General Comments: Daughter reports "minor cognitive impairments" recently. Tangential at times. Pt very pleasant and cooperative, able to make her needs known for toileting, etc, some slow processing at times.        Exercises Other Exercises Other Exercises: seated BLE AROM: hip flexion, LAQ x 10-15 reps ea Other Exercises: HEP handout brought to room for standing, seated and supine exercises, given to pt daughter to encourage her to perform daily    General Comments General comments (skin integrity, edema, etc.): SpO2 100% seated/resting on 4L O2 [place], with exertion briefly reading 95% on sensor, but otherwise poor pleth signal, assume pt WFL on 4L as no signficant s/sx distress or dyspnea observed; HR 98-110 bpm with exertion when seen on tele      Pertinent Vitals/Pain Pain Assessment Pain Assessment: No/denies pain    Home Living                          Prior Function            PT Goals (current goals can now be found in the care plan section) Acute Rehab PT Goals Patient Stated Goal: to go home PT Goal Formulation: With patient/family Time For Goal Achievement: 05/11/22 Progress towards PT goals: Progressing toward goals    Frequency    Min 1X/week      PT Plan Current plan remains appropriate    Co-evaluation              AM-PAC PT "6 Clicks" Mobility   Outcome Measure  Help needed turning from your back to your side while in a flat bed without using bedrails?: None Help needed moving from lying on your back to sitting on the side of a  flat bed without using bedrails?: None Help needed moving to and from a bed to a chair (including a wheelchair)?: A Little Help needed standing up from a chair using your arms (e.g., wheelchair or bedside chair)?: A Little Help needed to walk in hospital room?: A Little Help needed climbing 3-5 steps with a railing? : A Lot 6 Click Score: 19    End of Session Equipment Utilized During Treatment: Gait belt;Oxygen Activity Tolerance: Patient tolerated treatment well Patient left: in bed;with call bell/phone within reach;with bed alarm set;with family/visitor present (daughter in room, bed in chair posture) Nurse Communication: Mobility status;Other (comment) (poor pleth signal on 4L but no s/sx distress) PT Visit Diagnosis: Difficulty in walking, not elsewhere classified (R26.2);Unsteadiness on feet (R26.81)     Time: [id number] PT Time Calculation (min) (ACUTE ONLY): 38 min  Charges:  $Gait Training: 8-22 mins $Therapeutic Exercise: 8-22 mins $Therapeutic Activity: 8-22 mins  Houston Siren., PTA Acute Rehabilitation Services Secure Chat Preferred 9a-5:30pm Office: [place] 04/30/2022, 4:22 PM

## 2022-04-30 NOTE — Progress Notes (Signed)
Per d/w Dr. Harrell Gave, she discussed ICD plan with pt and they elected to leave ICD therapies on at this time. She does recommend to keep f/u 05/2022 as scheduled with Laurann Montana as planned, appt info placed on AVS.

## 2022-04-30 NOTE — Progress Notes (Addendum)
Progress Note  Patient Name: Jane [name] Date of Encounter: 04/30/2022  Primary Cardiologist: None  Subjective   She is getting washed up so APP encounter was brief. No acute complaints. Overall feeling better. MD to follow.  Inpatient Medications    Scheduled Meds:  apixaban  2.5 mg Oral BID   atorvastatin  20 mg Oral QHS   docusate sodium  100 mg Oral BID   feeding supplement  1 Container Oral TID BM   furosemide  20 mg Oral QODAY   furosemide  40 mg Oral QODAY   hydrocortisone   Rectal BID   methimazole  2.5 mg Oral Daily   metoprolol tartrate  25 mg Oral BID   midodrine  10 mg Oral BID WC   multivitamin with minerals  1 tablet Oral Daily   pantoprazole  40 mg Oral Daily   senna-docusate  1 tablet Oral Daily   sodium chloride flush  3 mL Intravenous Q12H   spironolactone  12.5 mg Oral Daily   sucralfate  1 g Oral TID WC & HS   Continuous Infusions:  PRN Meds: acetaminophen **OR** acetaminophen, bisacodyl, morphine injection, ondansetron **OR** ondansetron (ZOFRAN) IV, polyethylene glycol, witch hazel-glycerin   Vital Signs    Vitals:   04/29/22 1949 04/30/22 0042 04/30/22 0253 04/30/22 0721  BP: 108/68 (!) 102/57 124/73 131/67  Pulse: 80  83 77  Resp:  17 19 20   Temp: (!) 97.4 F (36.3 C) 97.6 F (36.4 C)  (!) 97.4 F (36.3 C)  TempSrc: Oral Oral  Oral  SpO2: 94% 95% 92% 90%  Weight:   40 kg   Height:        Intake/Output Summary (Last 24 hours) at 04/30/2022 1027 Last data filed at 04/30/2022 0901 Gross per 24 hour  Intake 360 ml  Output 150 ml  Net 210 ml      04/30/2022    2:53 AM 04/29/2022    4:55 AM 04/29/2022   12:10 AM  Last 3 Weights  Weight (lbs) 88 lb 1.6 oz 88 lb 12.8 oz 89 lb  Weight (kg) 39.962 kg 40.279 kg 40.37 kg     Telemetry    Predominantly NSR, AV pacing as well as A-sensing, V pacing at times, 24 beat run tachycardia that appears similar to native beating on the bottom lead but some difference on the top - Personally  Reviewed  Physical Exam   GEN: No acute distress. Very frail appearance HEENT: Normocephalic, atraumatic, sclera non-icteric. Neck: + JVD. No bruits. Cardiac: RRR, machinery like murmur best heard at LSB but auscultated over entire precordium, no rubs or gallops.  Respiratory: Clear to auscultation bilaterally. Breathing is unlabored. GI: Soft, nontender, non-distended, BS +x 4. MS: no deformity. Extremities: No clubbing or cyanosis. No edema. Distal pedal pulses are 2+ and equal bilaterally. Neuro:  AAOx3. Follows commands. Psych:  Responds to questions appropriately with a normal affect.  Labs    High Sensitivity Troponin:   Recent Labs  Lab 04/26/22 2241 04/27/22 0202  TROPONINIHS 38* 32*      Cardiac EnzymesNo results for input(s): "TROPONINI" in the last 168 hours. No results for input(s): "TROPIPOC" in the last 168 hours.   Chemistry Recent Labs  Lab 04/26/22 2122 04/28/22 0021 04/30/22 0022  NA 141 144 140  K 3.2* 3.7 3.3*  CL 108 108 108  CO2 23 26 25   GLUCOSE 118* 86 95  BUN 36* 31* 35*  CREATININE 1.47* 1.34* 1.35*  CALCIUM 9.1 8.8* 8.2*  PROT 7.1  --   --   ALBUMIN 3.4*  --   --   AST 16  --   --   ALT 15  --   --   ALKPHOS 65  --   --   BILITOT 1.1  --   --   GFRNONAA 34* 38* 38*  ANIONGAP 10 10 7      Hematology Recent Labs  Lab 04/26/22 2122 04/28/22 0021  WBC 10.0 9.6  RBC 4.24 3.62*  HGB 13.7 11.8*  HCT 44.4 38.7  MCV 104.7* 106.9*  MCH 32.3 32.6  MCHC 30.9 30.5  RDW 16.1* 15.8*  PLT 185 190    BNP Recent Labs  Lab 04/26/22 2241  BNP 1,839.9*     DDimer No results for input(s): "DDIMER" in the last 168 hours.   Radiology    DG UGI W SINGLE CM (SOL OR THIN BA)  Result Date: 04/29/2022 CLINICAL DATA:  Question gastric and duodenal abnormalities based on CT reading. EXAM: DG UGI W SINGLE CM TECHNIQUE: Single contrast examination was then performed using thin liquid barium. This exam was performed by Brynda Greathouse PA-C, and  was supervised and interpreted by Dr. Maree Erie. FLUOROSCOPY: Radiation Exposure Index (as provided by the fluoroscopic device): 1 minute 42 seconds 358.04 micro gray meter squared COMPARISON:  CT 04/26/2022 FINDINGS: Esophagus:  Normal appearance. Esophageal motility: Tertiary contractions observed leading to mild dysmotility with incomplete primary stripping wave with retrograde flow of barium bolus. Gastroesophageal reflux:  None visualized. Ingested 73mm barium tablet: Not given Stomach: Small hiatal hernia. No evidence of antral deformity. No ulceration or diverticulum. Gastric emptying: Normal. Duodenum: Normal appearance. No evidence of duodenal bulb pathology. No sign of intussusception. Other: Limited exam due to patient frailty, immobility, and clinical status. IMPRESSION: 1.  Small hiatal hernia 2.  Tertiary contractions leading to mild esophageal dysmotility. Otherwise normal examination. Antral and duodenal abnormality suggested by CT are not present. Electronically Signed   By: Nelson Chimes M.D.   On: 04/29/2022 11:24   ECHOCARDIOGRAM COMPLETE  Result Date: 04/29/2022    ECHOCARDIOGRAM REPORT   Patient Name:   Jane [name] Date of Exam: 04/29/2022 Medical Rec #:  [id number]    Height:       61.0 in Accession #:    [id number]   Weight:       88.8 lb Date of Birth:  [birth year]    BSA:          1.338 m Patient Age:    89 years     BP:           118/72 mmHg Patient Gender: F            HR:           75 bpm. Exam Location:  Inpatient Procedure: 2D Echo, Cardiac Doppler and Color Doppler Indications:    I50.31 CHF  History:        Patient has no prior history of Echocardiogram examinations and                 Patient has prior history of Echocardiogram examinations, most                 recent 11/06/2020. Cardiomyopathy, Mitral Valve Disease,                 Signs/Symptoms:Shortness of Breath; Risk Factors:Non-Smoker,                 Hypertension and Dyslipidemia.  Sonographer:  Wilkie Aye RVT RCS Referring  Phys: [place]  1. Left ventricular ejection fraction, by estimation, is 60 to 65%. The left ventricle has normal function. The left ventricle has no regional wall motion abnormalities. Left ventricular diastolic parameters are consistent with Grade II diastolic dysfunction (pseudonormalization).  2. Right ventricular systolic function is normal. The right ventricular size is normal.  3. Left atrial size was severely dilated.  4. Right atrial size was mildly dilated.  5. The mitral valve is degenerative. Severe mitral valve regurgitation with posterior leaflet restriction. Moderate mitral annular and valvular calcification. I suspect that there is at least mild mitral stenosis but I cannot measure mean valve gradient  from this study.  6. The tricuspid valve is abnormal. Tricuspid valve regurgitation is moderate to severe.  7. The aortic valve is tricuspid. There is moderate calcification of the aortic valve. Aortic valve regurgitation is trivial. Mild aortic valve stenosis. Aortic valve mean gradient measures 10.0 mmHg.  8. The inferior vena cava is dilated in size with <50% respiratory variability, suggesting right atrial pressure of 15 mmHg. FINDINGS  Left Ventricle: Left ventricular ejection fraction, by estimation, is 60 to 65%. The left ventricle has normal function. The left ventricle has no regional wall motion abnormalities. The left ventricular internal cavity size was normal in size. There is  no left ventricular hypertrophy. Left ventricular diastolic parameters are consistent with Grade II diastolic dysfunction (pseudonormalization). Right Ventricle: The right ventricular size is normal. No increase in right ventricular wall thickness. Right ventricular systolic function is normal. Left Atrium: Left atrial size was severely dilated. Right Atrium: Right atrial size was mildly dilated. Pericardium: There is no evidence of pericardial effusion. Mitral Valve: The mitral valve is  degenerative in appearance. There is moderate calcification of the mitral valve leaflet(s). Moderate mitral annular calcification. Severe mitral valve regurgitation. Tricuspid Valve: The tricuspid valve is abnormal. Tricuspid valve regurgitation is moderate to severe. Aortic Valve: The aortic valve is tricuspid. There is moderate calcification of the aortic valve. Aortic valve regurgitation is trivial. Aortic regurgitation PHT measures 498 msec. Mild aortic stenosis is present. Aortic valve mean gradient measures 10.0  mmHg. Aortic valve peak gradient measures 18.0 mmHg. Pulmonic Valve: The pulmonic valve was normal in structure. Pulmonic valve regurgitation is trivial. Aorta: The aortic root is normal in size and structure. Venous: The inferior vena cava is dilated in size with less than 50% respiratory variability, suggesting right atrial pressure of 15 mmHg. IAS/Shunts: No atrial level shunt detected by color flow Doppler. Additional Comments: A device lead is visualized in the right ventricle.  LEFT VENTRICLE PLAX 2D LVIDd:         4.50 cm LVIDs:         2.40 cm LV PW:         0.90 cm LV IVS:        0.80 cm LVOT diam:     1.70 cm LVOT Area:     2.27 cm  RIGHT VENTRICLE             IVC RV Basal diam:  3.90 cm     IVC diam: 2.10 cm RV S prime:     10.90 cm/s TAPSE (M-mode): 1.6 cm LEFT ATRIUM              Index         RIGHT ATRIUM           Index LA diam:  5.00 cm  3.74 cm/m    RA Area:     16.10 cm LA Vol (A2C):   180.0 ml 134.57 ml/m  RA Volume:   41.30 ml  30.88 ml/m LA Vol (A4C):   146.0 ml 109.15 ml/m LA Biplane Vol: 175.0 ml 130.84 ml/m  AORTIC VALVE                    PULMONIC VALVE AV Vmax:           212.00 cm/s  PV Vmax:          0.76 m/s AV Vmean:          142.000 cm/s PV Peak grad:     2.3 mmHg AV VTI:            0.408 m      PR End Diast Vel: 2.47 msec AV Peak Grad:      18.0 mmHg AV Mean Grad:      10.0 mmHg AI PHT:            498 msec AR Vena Contracta: 0.20 cm  AORTA Ao Root diam: 2.40  cm Ao Asc diam:  3.60 cm MITRAL VALVE                  TRICUSPID VALVE MV Area (PHT): 4.49 cm       TR Peak grad:   80.6 mmHg MV Decel Time: 169 msec       TR Vmax:        449.00 cm/s MR Peak grad:    144.5 mmHg MR Mean grad:    79.0 mmHg    SHUNTS MR Vmax:         601.00 cm/s  Systemic Diam: 1.70 cm MR Vmean:        406.0 cm/s MR PISA:         2.55 cm MR PISA Eff ROA: 21 mm MR PISA Radius:  0.80 cm MV E velocity: 172.00 cm/s MV A velocity: 113.00 cm/s MV E/A ratio:  1.52 Dalton McleanMD Electronically signed by Franki Monte Signature Date/Time: 04/29/2022/10:27:07 AM    Final     Cardiac Studies   2D echo 04/29/22   1. Left ventricular ejection fraction, by estimation, is 60 to 65%. The  left ventricle has normal function. The left ventricle has no regional  wall motion abnormalities. Left ventricular diastolic parameters are  consistent with Grade II diastolic  dysfunction (pseudonormalization).   2. Right ventricular systolic function is normal. The right ventricular  size is normal.   3. Left atrial size was severely dilated.   4. Right atrial size was mildly dilated.   5. The mitral valve is degenerative. Severe mitral valve regurgitation  with posterior leaflet restriction. Moderate mitral annular and valvular  calcification. I suspect that there is at least mild mitral stenosis but I  cannot measure mean valve gradient   from this study.   6. The tricuspid valve is abnormal. Tricuspid valve regurgitation is  moderate to severe.   7. The aortic valve is tricuspid. There is moderate calcification of the  aortic valve. Aortic valve regurgitation is trivial. Mild aortic valve  stenosis. Aortic valve mean gradient measures 10.0 mmHg.   8. The inferior vena cava is dilated in size with <50% respiratory  variability, suggesting right atrial pressure of 15 mmHg.    Patient Profile     89 y.o. female with CAD s/p MI and CABG/multiple PCI's, HLD, CKD 3b by  labs (Cr 1.5 in 12/2021),  severe mitral valve regurgitation not amenable to TEER due to mitral stenosis, paroxysmal afib, ischemic cardiomyopathy with recovered EF s/p CRT-D, hyperthyroidism, COPD (intended for 2L at home but pt declined), hiatal hernia, chronic abdominal pain with belching, who is admitted for poor PO intake, abdominal pain, early satiety, blenching. She has longstanding hx of cardiac and GI complaints, follows at [facility] records for detail. Found to have abnormal imaging with question gastric diverticulum versus malignancy and jejunal intussusception on CT abdomen, large renal cystic mass noted. Cardiology is following for CHF, initially consulted for pre EGD risk evaluation which is not recommended by GI.   Assessment & Plan    1. Abdominal pain, early satiety, abnormal imaging - question gastric diverticulum versus malignancy and jejunal intussusception on CT abdomen  - also large left renal cystic mass noted, workup for this deferred as inpatient - per IM, GI, palliative   2. Acute on chronic HFpEF, HTN, CKD 3b - transitioned to oral diuretic 3/19 with 40mg  daily alternating with 20mg  daily - still with JVD but overall breathing better than yesterday - also on spironolactone 12.5mg  daily, metoprolol tartrate 25mg  BID (home meds), and new midodrine 10mg  BIDWC given tendency for softer BP - Cr stable (baseline 1.5) - echo with EF 60-65%, G2DD, severe LAE, mild RAE, severe MR, mild AS, moderate-severe TR - very frail appearing, agree with palliative following - hypokalemia of 3.3-> will give 78meq x1   3. Severe MR, mild AS, moderate-severe TR as above - not previously felt to be a candidate for TEER, medical management   4. CAD, HLD - minimal troponin elevation not felt to be due to ACS, low suspicion for cardiac etiology - no ASA due to concomitant Eliquis - continued on home atorvastatin   5. PAF - AV pacing and occasional atrial sensed V pacing on telemetry - on Eliquis at reduced dose with  weight/age - on metoprolol 25mg  BID - with brief tachy, will review tele with MD    6. H/o CRT-D - historically followed at [facility] - Dr. Harrell Gave to review ICD functionality with MD given pt transition to DNR - did not discuss with pt acutely while she was being washed up by tech. Per records, device is [employer].   Tentatively made f/u with Laurann Montana on April 2 as pt wants to follow with Dr. Harrell Gave but she will discuss with pt final timing of f/u  For questions or updates, please contact [place] Please consult [email or web address] for contact info under Cardiology/STEMI.  Signed, Charlie Pitter, PA-C 04/30/2022, 10:27 AM

## 2022-04-30 NOTE — Progress Notes (Signed)
Occupational Therapy Treatment Patient Details Name: Jane [name] MRN: [id number] DOB: 1933/08/06 Today's Date: 04/30/2022   History of present illness Patient is a 89 y/o female who presents on 3/17 with RLQ pain and worsening dyspnea with associated edema. Found to have acute on chronic CHF.  Abdominal CT scan-evidence of gastric ulceration and possible jejunal intussusception however MD believes it is malignancy, awaiting GI consult. PMHincludes COPD, CHF, HTN, mitral regurgitation.   OT comments  Pt continuing to progress towards patient focused goals. Patient completing bed mobility with Mod I and functional ambulation with Min guard + RW. Mild balance deficits observed during ambulation as patient wouldn't maintain RW in a straight line. Pt attempted sit>stand from toilet without grab bars but was unable to lift bottom, Pt completed sit>stand from toilet with Min guard + RW and use of grab bars. Patient would benefit from continued skilled acute OT services to address above deficits and help transition to next level of care. Pt remains appropriate for Home OT services post acute with recommended level of supervision.   Recommendations for follow up therapy are one component of a multi-disciplinary discharge planning process, led by the attending physician.  Recommendations may be updated based on patient status, additional functional criteria and insurance authorization.    Follow Up Recommendations  Home health OT     Assistance Recommended at Discharge Frequent or constant Supervision/Assistance  Patient can return home with the following  Direct supervision/assist for medications management;Direct supervision/assist for financial management;Assist for transportation;Assistance with cooking/housework;A little help with walking and/or transfers;A little help with bathing/dressing/bathroom   Equipment Recommendations  BSC/3in1;Tub/shower seat    Recommendations for Other Services       Precautions / Restrictions Precautions Precautions: Fall;Other (comment) Precaution Comments: watch 02 Restrictions Weight Bearing Restrictions: No       Mobility Bed Mobility Overal bed mobility: Modified Independent             General bed mobility comments: Pt uses rails to assis with supine>sit EOB    Transfers Overall transfer level: Needs assistance Equipment used: Rolling walker (2 wheels) Transfers: Sit to/from Stand Sit to Stand: Min guard           General transfer comment: Cues for hand placement with RW use     Balance Overall balance assessment: Mild deficits observed, not formally tested (with use of RW) Sitting-balance support: Feet supported, No upper extremity supported Sitting balance-Leahy Scale: Good     Standing balance support: During functional activity, Bilateral upper extremity supported Standing balance-Leahy Scale: Poor                             ADL either performed or assessed with clinical judgement   ADL Overall ADL's : Needs assistance/impaired                         Toilet Transfer: Ambulation;Min guard;Grab bars Toilet Transfer Details (indicate cue type and reason): Pt needed grab bar to complete sit>stand Min guard from toilet. Pt unable to rise from toilet without grab bar         Functional mobility during ADLs: Min guard;Rolling walker (2 wheels)      Extremity/Trunk Assessment Upper Extremity Assessment Upper Extremity Assessment: Generalized weakness   Lower Extremity Assessment Lower Extremity Assessment: Generalized weakness (but WFL)   Cervical / Trunk Assessment Cervical / Trunk Assessment: Kyphotic    Vision Baseline Vision/History: 6 Macular  Degeneration Vision Assessment?: Yes Tracking/Visual Pursuits: Able to track stimulus in all quads without difficulty Additional Comments: Pt tracking visual stimuli, no difficulty seeing date printed on wall and number of chairs down  hallway. Patient accurately able to state number of fingers on therapists hand. Patient's daughter reports she does need another shot for her MD, continue to monitor patients vision while in acute setting.   Perception Perception Perception: Not tested   Praxis Praxis Praxis: Not tested    Cognition Arousal/Alertness: Awake/alert Behavior During Therapy: WFL for tasks assessed/performed Overall Cognitive Status: Impaired/Different from baseline                         Following Commands: Follows one step commands with increased time       General Comments: Patient seldomly mentions something that had nothing to do with current conversation.        Exercises      Shoulder Instructions       General Comments No SOB noted but Patient desat to Sp02 85% on RA when sitting EOB at end of OT session, unsure if pulse ox was picking up good reading after attempting multiple fingers. Patient returned to 4L 02 and Sp02 returned to 98% after taking time to read pulse    Pertinent Vitals/ Pain       Pain Assessment Pain Assessment: No/denies pain  Home Living                                          Prior Functioning/Environment              Frequency  Min 2X/week        Progress Toward Goals  OT Goals(current goals can now be found in the care plan section)  Progress towards OT goals: Progressing toward goals  Acute Rehab OT Goals Patient Stated Goal: to go home OT Goal Formulation: With patient Time For Goal Achievement: 05/11/22 Potential to Achieve Goals: Good  Plan Frequency remains appropriate;Discharge plan remains appropriate    Co-evaluation                 AM-PAC OT "6 Clicks" Daily Activity     Outcome Measure   Help from another person eating meals?: None Help from another person taking care of personal grooming?: A Little Help from another person toileting, which includes using toliet, bedpan, or urinal?: A  Little Help from another person bathing (including washing, rinsing, drying)?: A Lot Help from another person to put on and taking off regular upper body clothing?: A Little Help from another person to put on and taking off regular lower body clothing?: A Lot 6 Click Score: 17    End of Session Equipment Utilized During Treatment: Gait belt;Oxygen;Rolling walker (2 wheels)  OT Visit Diagnosis: Unsteadiness on feet (R26.81);Other abnormalities of gait and mobility (R26.89)   Activity Tolerance Patient tolerated treatment well   Patient Left with family/visitor present;with bed alarm set;with call bell/phone within reach;in bed   Nurse Communication Mobility status        Time: [id number] OT Time Calculation (min): 28 min  Charges: OT General Charges $OT Visit: 1 Visit OT Treatments $Therapeutic Activity: 23-37 mins  04/30/2022  AB, OTR/L  Acute Rehabilitation Services  Office: [phone]   Cori Razor 04/30/2022, 6:31 PM

## 2022-04-30 NOTE — Progress Notes (Signed)
Daily Progress Note   Patient Name: Jane [name]       Date: 04/30/2022 DOB: 1934/01/02  Age: 89 y.o. MRN#: [id number] Attending Physician: Tawni Millers Primary Care Physician: System, Provider Not In Admit Date: 04/26/2022  Reason for Consultation/Follow-up: Establishing goals of care  Subjective: Awake, alert. No complaints. Daughter Vida Roller at bedside. She appreciated discussion with Cheri about hospice services. She and her sister are hoping to hear from patient's primary cardiologist about recommendations before making final decisions, but are leaning towards home with hospice.   Length of Stay: 3  Current Medications: Scheduled Meds:   apixaban  2.5 mg Oral BID   atorvastatin  20 mg Oral QHS   docusate sodium  100 mg Oral BID   feeding supplement  1 Container Oral TID BM   furosemide  20 mg Oral QODAY   furosemide  40 mg Oral QODAY   hydrocortisone   Rectal BID   methimazole  2.5 mg Oral Daily   metoprolol tartrate  25 mg Oral BID   midodrine  10 mg Oral BID WC   multivitamin with minerals  1 tablet Oral Daily   pantoprazole  40 mg Oral Daily   senna-docusate  1 tablet Oral Daily   sodium chloride flush  3 mL Intravenous Q12H   spironolactone  12.5 mg Oral Daily   sucralfate  1 g Oral TID WC & HS    Continuous Infusions:   PRN Meds: acetaminophen **OR** acetaminophen, bisacodyl, morphine injection, ondansetron **OR** ondansetron (ZOFRAN) IV, polyethylene glycol, witch hazel-glycerin  Physical Exam Constitutional:      General: She is not in acute distress.    Comments: frail  Pulmonary:     Comments: Slightly increased RR Skin:    General: Skin is warm and dry.  Neurological:     Mental Status: She is alert.  Psychiatric:     Comments: pleasant              Vital Signs: BP 115/73 (BP Location: Left Arm)   Pulse 75   Temp 97.6 F (36.4 C) (Oral)   Resp 17   Ht 5\' 1"  (1.549 m)   Wt 40 kg   SpO2 100%   BMI 16.65 kg/m  SpO2: SpO2: 100 % O2 Device: O2 Device: Nasal Cannula O2 Flow Rate: O2 Flow Rate (L/min): 3 L/min  Intake/output summary:  Intake/Output Summary (Last 24 hours) at 04/30/2022 1736 Last data filed at 04/30/2022 0901 Gross per 24 hour  Intake 360 ml  Output 150 ml  Net 210 ml   LBM: Last BM Date : 04/28/22 Baseline Weight: Weight: 41.3 kg Most recent weight: Weight: 40 kg       Palliative Assessment/Data: PPS 40%      Patient Active Problem List   Diagnosis Date Noted   Protein-calorie malnutrition, severe 04/29/2022   Abnormal CT of the abdomen 04/29/2022   Atrial fibrillation, chronic (HCC) 04/29/2022   Intussusception ([place]) 04/28/2022   Acute on chronic systolic (congestive) heart failure (HCC) 04/27/2022   Hiatal hernia 04/27/2022   Elevated brain natriuretic peptide (BNP) level 04/27/2022   Shortness of breath 04/27/2022   Counseling regarding goals of care 04/27/2022   Physical deconditioning  02/05/2015   Coronary artery disease involving coronary bypass graft of native heart with unstable angina pectoris ([place]) 02/05/2015   Other cardiac arrhythmia 02/05/2015   Constipation 02/05/2015   Hyperthyroidism 02/05/2015   Anemia, iron deficiency 02/05/2015   Situational anxiety 02/05/2015   Cardiomyopathy, ischemic 02/05/2015   Hyperlipidemia 02/05/2015    Palliative Care Assessment & Plan   HPI: 89 y.o. female  with past medical history of COPD on home O2, CAD s/p 7 stents, chronic diastolic CHF, severe mitral regurgitation not a candidate for mitral clip, paroxysmal A-fib on Eliquis, pacemaker/ICD, and hiatal hernia admitted on 04/26/2022 with increased shortness of breath, nausea, belching, and poor appetite.  Patient is being treated for acute on chronic diastolic CHF.  Patient also with severe MR.  Question gastric diverticulum versus malignancy and jejunal intussusception on CT abdomen.  PMT consulted to discuss goals of care given patient's progressive decline, frailty, multiple medical problems and advanced age.   Recommendations/Plan: Family leaning towards home with hospice- awaiting input from other providers Do not wish to deactivate ICD at this time  Care plan was discussed with patient, and daughter Vida Roller  Thank you for allowing the Palliative Medicine Team to assist in the care of this patient.  Mariana Kaufman, [facility] Palliative Medicine  Team Phone # [phone]

## 2022-04-30 NOTE — Progress Notes (Signed)
   I had a very lengthy discussion with the daughter Jane [name] today about the difference between hospice care and Palliative care ([place]) services with [facility]. She has a lot of misconceptions about hospice services from her mother being referred to hospice when she lived in [place] a year ago. We were able to clarify these questions and alleviate some of her fears with hospice. She was also introduced to the [facility] and palliative care options as well.   The daughter Jane [name] would like to discuss this with her sister and then will get back to [place] and let us know there choice.   Webb Silversmith RN [phone]

## 2022-05-01 ENCOUNTER — Other Ambulatory Visit (HOSPITAL_COMMUNITY): Payer: Self-pay

## 2022-05-01 DIAGNOSIS — R109 Unspecified abdominal pain: Secondary | ICD-10-CM

## 2022-05-01 DIAGNOSIS — Z7189 Other specified counseling: Secondary | ICD-10-CM | POA: Diagnosis not present

## 2022-05-01 DIAGNOSIS — N1832 Chronic kidney disease, stage 3b: Secondary | ICD-10-CM

## 2022-05-01 DIAGNOSIS — E43 Unspecified severe protein-calorie malnutrition: Secondary | ICD-10-CM | POA: Diagnosis not present

## 2022-05-01 DIAGNOSIS — I482 Chronic atrial fibrillation, unspecified: Secondary | ICD-10-CM | POA: Diagnosis not present

## 2022-05-01 DIAGNOSIS — K449 Diaphragmatic hernia without obstruction or gangrene: Secondary | ICD-10-CM | POA: Diagnosis not present

## 2022-05-01 DIAGNOSIS — I5023 Acute on chronic systolic (congestive) heart failure: Secondary | ICD-10-CM | POA: Diagnosis not present

## 2022-05-01 DIAGNOSIS — D509 Iron deficiency anemia, unspecified: Secondary | ICD-10-CM | POA: Diagnosis not present

## 2022-05-01 DIAGNOSIS — R0602 Shortness of breath: Secondary | ICD-10-CM | POA: Diagnosis not present

## 2022-05-01 LAB — BASIC METABOLIC PANEL
Anion gap: 8 (ref 5–15)
BUN: 36 mg/dL — ABNORMAL HIGH (ref 8–23)
CO2: 23 mmol/L (ref 22–32)
Calcium: 8.7 mg/dL — ABNORMAL LOW (ref 8.9–10.3)
Chloride: 109 mmol/L (ref 98–111)
Creatinine, Ser: 1.36 mg/dL — ABNORMAL HIGH (ref 0.44–1.00)
GFR, Estimated: 38 mL/min — ABNORMAL LOW (ref >60)
Glucose, Bld: 112 mg/dL — ABNORMAL HIGH (ref 70–99)
Potassium: 4.5 mmol/L (ref 3.5–5.1)
Sodium: 140 mmol/L (ref 135–145)

## 2022-05-01 LAB — CBC
HCT: 40.5 % (ref 36.0–46.0)
Hemoglobin: 12.2 g/dL (ref 12.0–15.0)
MCH: 32.6 pg (ref 26.0–34.0)
MCHC: 30.1 g/dL (ref 30.0–36.0)
MCV: 108.3 fL — ABNORMAL HIGH (ref 80.0–100.0)
Platelets: 220 10*3/uL (ref 150–400)
RBC: 3.74 MIL/uL — ABNORMAL LOW (ref 3.87–5.11)
RDW: 15.6 % — ABNORMAL HIGH (ref 11.5–15.5)
WBC: 9.4 10*3/uL (ref 4.0–10.5)
nRBC: 0 % (ref 0.0–0.2)

## 2022-05-01 MED ORDER — FUROSEMIDE 40 MG PO TABS
40.0000 mg | ORAL_TABLET | Freq: Every day | ORAL | 0 refills | Status: DC
  Filled 2022-05-01: qty 30, 30d supply, fill #0

## 2022-05-01 MED ORDER — MORPHINE SULFATE (CONCENTRATE) 10 MG/0.5ML PO SOLN
2.5000 mg | ORAL | Status: DC | PRN
  Administered 2022-05-01: 2.6 mg via ORAL
  Filled 2022-05-01: qty 0.5

## 2022-05-01 MED ORDER — MIDODRINE HCL 10 MG PO TABS
5.0000 mg | ORAL_TABLET | Freq: Two times a day (BID) | ORAL | 0 refills | Status: DC
  Filled 2022-05-01: qty 30, 30d supply, fill #0

## 2022-05-01 MED ORDER — FUROSEMIDE 10 MG/ML IJ SOLN
40.0000 mg | Freq: Once | INTRAMUSCULAR | Status: AC
  Administered 2022-05-01: 40 mg via INTRAVENOUS
  Filled 2022-05-01: qty 4

## 2022-05-01 NOTE — Progress Notes (Signed)
Rounding Note    Patient Name: Jane [name] Date of Encounter: 05/01/2022  [place] Cardiologist: Buford Dresser, MD plus [facility] and [facility]  Subjective   Spent extensive time at bedside today. Patient is frustrated, we discussed role for both palliative care or hospice on discharge. Discussed my phone call with her primary cardiologist Dr. Alycia Rossetti at [facility]. She is feeling similar to prior, breathing slightly better than yesterday.  Inpatient Medications    Scheduled Meds:  apixaban  2.5 mg Oral BID   atorvastatin  20 mg Oral QHS   docusate sodium  100 mg Oral BID   feeding supplement  1 Container Oral TID BM   furosemide  40 mg Oral QODAY   hydrocortisone   Rectal BID   methimazole  2.5 mg Oral Daily   metoprolol tartrate  25 mg Oral BID   midodrine  10 mg Oral BID WC   multivitamin with minerals  1 tablet Oral Daily   pantoprazole  40 mg Oral Daily   senna-docusate  1 tablet Oral Daily   sodium chloride flush  3 mL Intravenous Q12H   spironolactone  12.5 mg Oral Daily   sucralfate  1 g Oral TID WC & HS   Continuous Infusions:  PRN Meds: acetaminophen **OR** acetaminophen, bisacodyl, morphine injection, morphine CONCENTRATE, ondansetron **OR** ondansetron (ZOFRAN) IV, polyethylene glycol, witch hazel-glycerin   Vital Signs    Vitals:   05/01/22 0528 05/01/22 0539 05/01/22 0746 05/01/22 1116  BP: 106/64  (!) 146/132 107/66  Pulse: 76  86   Resp: (!) 22  (P) 18 20  Temp: (!) 97.2 F (36.2 C)   (!) 97.5 F (36.4 C)  TempSrc: Axillary   Oral  SpO2: 99%  95% 96%  Weight:  40.1 kg    Height:        Intake/Output Summary (Last 24 hours) at 05/01/2022 1547 Last data filed at 04/30/2022 1834 Gross per 24 hour  Intake 120 ml  Output --  Net 120 ml      05/01/2022    5:39 AM 04/30/2022    2:53 AM 04/29/2022    4:55 AM  Last 3 Weights  Weight (lbs) 88 lb 6.4 oz 88 lb 1.6 oz 88 lb 12.8 oz  Weight (kg) 40.098 kg 39.962 kg 40.279 kg      Telemetry     Dual AV paced. Two brief episodes of NSVT - Personally Reviewed  ECG    No new since 3/17 - Personally Reviewed  Physical Exam   GEN: No acute distress.   Neck: +TR wave to upper neck, JVD at clavicle Cardiac: RRR, no rubs or gallops. 3-4/6 machinelike holosystolic murmur throughout precordium Respiratory: Clear to auscultation in upper fields, mildly diminished at bases GI: Soft, nontender, non-distended  MS: No edema; No deformity. Neuro:  Nonfocal  Psych: Normal affect   Labs    High Sensitivity Troponin:   Recent Labs  Lab 04/26/22 2241 04/27/22 0202  TROPONINIHS 38* 32*     Chemistry Recent Labs  Lab 04/26/22 2122 04/28/22 0021 04/30/22 0022 05/01/22 0039  NA 141 144 140 140  K 3.2* 3.7 3.3* 4.5  CL 108 108 108 109  CO2 23 26 25 23   GLUCOSE 118* 86 95 112*  BUN 36* 31* 35* 36*  CREATININE 1.47* 1.34* 1.35* 1.36*  CALCIUM 9.1 8.8* 8.2* 8.7*  PROT 7.1  --   --   --   ALBUMIN 3.4*  --   --   --  AST 16  --   --   --   ALT 15  --   --   --   ALKPHOS 65  --   --   --   BILITOT 1.1  --   --   --   GFRNONAA 34* 38* 38* 38*  ANIONGAP 10 10 7 8     Lipids No results for input(s): "CHOL", "TRIG", "HDL", "LABVLDL", "LDLCALC", "CHOLHDL" in the last 168 hours.  Hematology Recent Labs  Lab 04/26/22 2122 04/28/22 0021 05/01/22 0039  WBC 10.0 9.6 9.4  RBC 4.24 3.62* 3.74*  HGB 13.7 11.8* 12.2  HCT 44.4 38.7 40.5  MCV 104.7* 106.9* 108.3*  MCH 32.3 32.6 32.6  MCHC 30.9 30.5 30.1  RDW 16.1* 15.8* 15.6*  PLT 185 190 220   Thyroid No results for input(s): "TSH", "FREET4" in the last 168 hours.  BNP Recent Labs  Lab 04/26/22 2241  BNP 1,839.9*    DDimer No results for input(s): "DDIMER" in the last 168 hours.   Radiology    No results found.  Cardiac Studies   2D echo 04/29/22   1. Left ventricular ejection fraction, by estimation, is 60 to 65%. The  left ventricle has normal function. The left ventricle has no regional  wall motion  abnormalities. Left ventricular diastolic parameters are  consistent with Grade II diastolic  dysfunction (pseudonormalization).   2. Right ventricular systolic function is normal. The right ventricular  size is normal.   3. Left atrial size was severely dilated.   4. Right atrial size was mildly dilated.   5. The mitral valve is degenerative. Severe mitral valve regurgitation  with posterior leaflet restriction. Moderate mitral annular and valvular  calcification. I suspect that there is at least mild mitral stenosis but I  cannot measure mean valve gradient   from this study.   6. The tricuspid valve is abnormal. Tricuspid valve regurgitation is  moderate to severe.   7. The aortic valve is tricuspid. There is moderate calcification of the  aortic valve. Aortic valve regurgitation is trivial. Mild aortic valve  stenosis. Aortic valve mean gradient measures 10.0 mmHg.   8. The inferior vena cava is dilated in size with <50% respiratory  variability, suggesting right atrial pressure of 15 mmHg.  Patient Profile     89 y.o. female with CAD s/p MI and CABG/multiple PCI's, HLD, CKD 3b by labs (Cr 1.5 in 12/2021), severe mitral valve regurgitation not amenable to TEER due to mitral stenosis, paroxysmal afib, ischemic cardiomyopathy with recovered EF s/p CRT-D, hyperthyroidism, COPD (intended for 2L at home but pt declined), hiatal hernia, chronic abdominal pain with belching, who is admitted for poor PO intake, abdominal pain, early satiety, blenching. She has longstanding hx of cardiac and GI complaints, follows at [facility] records for detail. Found to have abnormal imaging with question gastric diverticulum versus malignancy and jejunal intussusception on CT abdomen, large renal cystic mass noted. Cardiology is following for CHF, initially consulted for pre EGD risk evaluation which is not recommended by GI.   Assessment & Plan    Abdominal pain -per IM, GI -had abnormal CT, gastric  diverticulum vs. Malignancy, jejunal intussusception  Acute on chronic systolic and diastolic heart failure Severe MR Mild MS Moderate-Severe TR -not a candidate for valve intervention, see [facility] evaluation -continue lasix and spironolactone  History of CAD with prior CABG and PCIs Hyperlipidemia -no aspirin as she is on apixaban -continue atorvastatin  Paroxysmal atrial fibrillation CRT-D -offered to turn off  defibrillation option on pacemaker. They are not quite ready to decide this yet -continue apixaban at reduced dose -rate control with metoprolol  History of hypertension, now with hypotension -home meds of empagliflozin, lisinopril due to low pressures -has required midodrine to support BP  Plans are to go home with hospice.  [place] will sign off.   Medication Recommendations:   Continue apixaban, atorvastatin, furosemide, metoprolol, spironolactone Hold empagliflozin, lisinopril given hypotension Has required midodrine this admission Other recommendations (labs, testing, etc):  none Follow up as an outpatient:  She is amenable following up with Laurann Montana, NP at our [place] office on 4/2.  For questions or updates, please contact [place] Please consult [email or web address] for contact info under        Signed, Buford Dresser, MD  05/01/2022, 3:47 PM

## 2022-05-01 NOTE — Progress Notes (Addendum)
Physical Therapy Treatment Patient Details Name: Jane [name] MRN: [id number] DOB: [birth year] Today's Date: 05/01/2022   History of Present Illness Patient is a 89 y/o female who presents on 3/17 with RLQ pain and worsening dyspnea with associated edema. Found to have acute on chronic CHF.  Abdominal CT scan-evidence of gastric ulceration and possible jejunal intussusception however MD believes it is malignancy, awaiting GI consult. PMHincludes COPD, CHF, HTN, mitral regurgitation.    PT Comments    Pt tolerated treatment well today. Pt was able to ambulate in hallway and navigate 3 stairs min guard assist. Pt anticipates DC home this weekend with HHPT. Pt has met all acute PT goals and PT will be signing off. Pt will be going home with daughter who is able to provide 24/7 assistance. Pt current home set up has her bedroom on the second floor requiring 17 steps to ascend however pt daughter states that pt can sleep downstairs for the time being. Pt and daughter also educated that RW would be best for mobility currently versus rollator. Given this change, pt mobility status is adequate for safe DC. Pt would benefit from continued mobility with mobility specialist during acute stay. Re consult PT if mobility status changes.   Recommendations for follow up therapy are one component of a multi-disciplinary discharge planning process, led by the attending physician.  Recommendations may be updated based on patient status, additional functional criteria and insurance authorization.  Follow Up Recommendations  Home health PT     Assistance Recommended at Discharge Frequent or constant Supervision/Assistance  Patient can return home with the following A little help with walking and/or transfers;A little help with bathing/dressing/bathroom;Help with stairs or ramp for entrance;Assist for transportation;Assistance with cooking/housework;Direct supervision/assist for medications management;Direct  supervision/assist for financial management   Equipment Recommendations  Rolling walker (2 wheels) Netty Starring)    Recommendations for Other Services       Precautions / Restrictions Precautions Precautions: Fall;Other (comment) Precaution Comments: watch 02 Restrictions Weight Bearing Restrictions: No     Mobility  Bed Mobility Overal bed mobility: Modified Independent             General bed mobility comments: Pt uses rails to assis with supine>sit EOB. HOB elevated    Transfers Overall transfer level: Needs assistance Equipment used: Rolling walker (2 wheels) Transfers: Sit to/from Stand Sit to Stand: Min guard, Min assist           General transfer comment: Cues for hand placement with RW use. Pt initially had one minor LOB when standing requiring Min A to correct.    Ambulation/Gait Ambulation/Gait assistance: Min guard Gait Distance (Feet): 300 Feet Assistive device: Rolling walker (2 wheels) Gait Pattern/deviations: Step-through pattern, Decreased stride length, Drifts right/left Gait velocity: decreased     General Gait Details: Slow steady gait with RW. No LOB noted. Minimal cues for obstacle navigation.   Stairs Stairs: Yes Stairs assistance: Min guard Stair Management: Two rails, Forwards, Step to pattern Number of Stairs: 3 General stair comments: No LOB noted.   Wheelchair Mobility    Modified Rankin (Stroke Patients Only)       Balance Overall balance assessment: Mild deficits observed, not formally tested                                          Cognition Arousal/Alertness: Awake/alert Behavior During Therapy: WFL for tasks assessed/performed Overall Cognitive  Status: Impaired/Different from baseline Area of Impairment: Memory, Following commands                 Orientation Level: Time, Disoriented to   Memory: Decreased short-term memory Following Commands: Follows one step commands with increased  time       General Comments: Pt initially confused RW with Rollator and tried to "unlock" the brakes        Exercises      General Comments General comments (skin integrity, edema, etc.): Pt received on 3L O2. Pt remained above 96% on 3L for duration of session.      Pertinent Vitals/Pain Pain Assessment Pain Assessment: No/denies pain    Home Living                          Prior Function            PT Goals (current goals can now be found in the care plan section) Progress towards PT goals: Goals met/education completed, patient discharged from PT    Frequency    Other (Comment) (DCPT)      PT Plan Discharge plan needs to be updated    Co-evaluation              AM-PAC PT "6 Clicks" Mobility   Outcome Measure  Help needed turning from your back to your side while in a flat bed without using bedrails?: None Help needed moving from lying on your back to sitting on the side of a flat bed without using bedrails?: None Help needed moving to and from a bed to a chair (including a wheelchair)?: A Little Help needed standing up from a chair using your arms (e.g., wheelchair or bedside chair)?: A Little Help needed to walk in hospital room?: A Little Help needed climbing 3-5 steps with a railing? : A Little 6 Click Score: 20    End of Session Equipment Utilized During Treatment: Gait belt;Oxygen Activity Tolerance: Patient tolerated treatment well Patient left: in bed;with call bell/phone within reach;with bed alarm set;with family/visitor present (Daughter in room) Nurse Communication: Mobility status PT Visit Diagnosis: Difficulty in walking, not elsewhere classified (R26.2);Unsteadiness on feet (R26.81)     Time: [id number] PT Time Calculation (min) (ACUTE ONLY): 27 min  Charges:  $Gait Training: 23-37 mins                     Shelby Mattocks, PT, DPT Acute Rehab Services [id number]    Viann Shove 05/01/2022, 4:53 PM

## 2022-05-01 NOTE — Plan of Care (Signed)

## 2022-05-01 NOTE — Progress Notes (Signed)
   I have reached out to the daughter [name] to answer any further questions and she may have about the difference in our services hospice care and Palliative Care. She did get the opportunity to speak to her sister, [name], and has requested that [name] call me to answer additional questions that she has. I did get in touch with [name] and was able to answer questions and explain the differences in the services as well. The daughter [name] states that they will both be together this weekend and will discuss amongst themselves and let us know what they decide.   Webb Silversmith RN [phone]

## 2022-05-01 NOTE — Progress Notes (Signed)
Daily Progress Note   Patient Name: Jane [name]       Date: 05/01/2022 DOB: 02/07/1934  Age: 89 y.o. MRN#: [id number] Attending Physician: Tawni Millers Primary Care Physician: System, Provider Not In Admit Date: 04/26/2022  Reason for Consultation/Follow-up: Establishing goals of care  Patient Profile/HPI:  89 y.o. female  with past medical history of COPD on home O2, CAD s/p 7 stents, chronic diastolic CHF, severe mitral regurgitation not a candidate for mitral clip, paroxysmal A-fib on Eliquis, pacemaker/ICD, and hiatal hernia admitted on 04/26/2022 with increased shortness of breath, nausea, belching, and poor appetite.  Patient is being treated for acute on chronic diastolic CHF.  Patient also with severe MR. Question gastric diverticulum versus malignancy and jejunal intussusception on CT abdomen.  PMT consulted to discuss goals of care given patient's progressive decline, frailty, multiple medical problems and advanced age.   Subjective: Chart reviewed including labs, progress notes, imaging from this and previous encounters.  Jane [name] is awake and alert. Having some increased shortness of breath last night and this morning. Abdominal pain, pressure, fullness also recurring.  Daughter Vida Roller is at bedside.  We discussed using morphine for symptom relief.  I also called and spoke with daughter [name] as well.  They are continuing to gather information and make decisions about plan of care when [facility] leaves the hospital.   Review of Systems  Respiratory:  Positive for shortness of breath.      Physical Exam Vitals and nursing note reviewed.  Pulmonary:     Comments: Increased rate Neurological:     Mental Status: She is alert. Mental status is at baseline.              Vital Signs: BP 106/64 (BP Location: Left Arm)   Pulse 76   Temp (!) 97.2 F (36.2 C) (Axillary)   Resp (!) 22   Ht 5\' 1"  (1.549 m)   Wt 40.1 kg   SpO2 99%   BMI 16.70 kg/m  SpO2: SpO2: 99 % O2 Device: O2 Device: Nasal Cannula O2 Flow Rate: O2 Flow Rate (L/min): 3 L/min  Intake/output summary:  Intake/Output Summary (Last 24 hours) at 05/01/2022 1120 Last data filed at 04/30/2022 1834 Gross per 24 hour  Intake 240 ml  Output --  Net 240 ml   LBM: Last BM Date :  04/28/22 Baseline Weight: Weight: 41.3 kg Most recent weight: Weight: 40.1 kg       Palliative Assessment/Data: PPS: 30%      Patient Active Problem List   Diagnosis Date Noted   Stage 3b chronic kidney disease (CKD) ([place]) 04/30/2022   Protein-calorie malnutrition, severe 04/29/2022   Abnormal CT of the abdomen 04/29/2022   Atrial fibrillation, chronic ([place]) 04/29/2022   Intussusception ([place]) 04/28/2022   Acute on chronic systolic (congestive) heart failure ([place]) 04/27/2022   Hiatal hernia 04/27/2022   Elevated brain natriuretic peptide (BNP) level 04/27/2022   Shortness of breath 04/27/2022   Counseling regarding goals of care 04/27/2022   Physical deconditioning 02/05/2015   Coronary artery disease involving coronary bypass graft of native heart with unstable angina pectoris ([place]) 02/05/2015   Other cardiac arrhythmia 02/05/2015   Constipation 02/05/2015   Hyperthyroidism 02/05/2015   Anemia, iron deficiency 02/05/2015   Situational anxiety 02/05/2015   Cardiomyopathy, ischemic 02/05/2015   Hyperlipidemia 02/05/2015    Palliative Care Assessment & Plan    Assessment/Recommendations/Plan  Morphine liquid 2.5mg  po q2hr prn for shortness of breath, pain Plan for hospice at discharge    Code Status: DNR  Prognosis:  < 3 months  Discharge Planning: Home with Hospice  Care plan was discussed with patient and family.  Thank you for allowing the Palliative Medicine Team to assist in the  care of this patient.  Greater than 50%  of this time was spent counseling and coordinating care related to the above assessment and plan.  Mariana Kaufman, AGNP-C Palliative Medicine   Please contact Palliative Medicine Team phone at [phone] for questions and concerns.

## 2022-05-01 NOTE — TOC Progression Note (Addendum)
Transition of Care [facility]) - Progression Note    Patient Details  Name: Harseerat Lemoi MRN: [id number] Date of Birth: 1933/09/19  Transition [facility] CM/SW Contact  Zenon Mayo, RN Phone Number: 05/01/2022, 11:08 AM  Clinical Narrative:    Daughter will have sister to call Cheri at [facility] so they can make a decision. Daughter states she is waiting for her sister to get here this evening to make choice of which Hospice they want.  Plan is to then dc on Saturday home with hospice.  Patient is from [facility] and she has oxygen at home with [facility] [phone], per Cheri with [facility] this will have to be switched out , if patient chooses them they will switch it out.  Just waiting for other sister to get here this evening to make Hospice choice per sister at the bedside.        Expected Discharge Plan and Services                                               Social Determinants of Health (SDOH) Interventions SDOH Screenings   Tobacco Use: Low Risk  (04/27/2022)    Readmission Risk Interventions     No data to display

## 2022-05-01 NOTE — Discharge Summary (Addendum)
Physician Discharge Summary   Patient: Jane [name] MRN: [id number] DOB: Apr 02, 1933  Admit date:     04/26/2022  Discharge date: 05/01/22  Discharge Physician: Tawni Millers   PCP: System, Provider Not In   Recommendations at discharge:    Patient will continue diuresis with furosemide and spironolactone. Instructed to have small portion meals, and eat upright to avoid dyspepsia.  She had advance heart failure, valvular heart disease with poor prognosis, not candidate for advance therapies. Patient will follow up with hospice as outpatient. Follow up renal function as outpatient Follow up with Primary Care in 7 to 10 days.  Follow up with Cardiology as scheduled.  Continue supplemental 02 per [place].   Discharge Diagnoses: Principal Problem:   Acute on chronic systolic (congestive) heart failure (HCC) Active Problems:   Hiatal hernia   Anemia, iron deficiency   Hyperthyroidism   Hyperlipidemia   Protein-calorie malnutrition, severe   Atrial fibrillation, chronic (HCC)   Stage 3b chronic kidney disease (CKD) ([place])  Resolved Problems:   * No resolved hospital problems. [facility] Course: Jane [name] was admitted to the hospital with the working diagnosis of decompensated heart failure.   89 yo female with the past medical history of hypertension, COPD, coronary artery disease, atrial fibrillation and heart failure who presented with abdominal pain. Reported severe intermittent abdominal pain. She was evaluated at [facility] CT with evidence of gastric ulceration and possible jejunal intussusception. At home patient had lower extremity edema and dyspnea for several days. On her initial physical examination blood pressure 100/52, HR 74, RR 24 and 02 saturation 100%, lungs with no wheezing or rales, heart with S1 and S2 present and rhythmic, abdomen with no distention and positive lower extremity edema.   Na 141, K 3,2 CL 108, bicarbonate 23, glucose 118, bun 36 cr 1,47.  BNP  1,839 High sensitive troponin 38 and 32  Lactic acid 1,0  Wbc 10,9 hgb 13,7 plt 185   Urine analysis with SG 1,010, 6-10 wbc, negative protein.  Urine culture 30,000 CFU enterococcus faecalis.   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 Short segment intussusception involving the proximal duodenum.  Large lobulated multi septated exophytic left renal cyst. (Recommended follow up with non emergent [place]).   Chest radiograph with cardiomegaly, bilateral hilar vascular congestion, bilateral interstitial infiltrates, pacemaker defibrillator in place, with one atrial lead and biventricular leads. Sternotomy wires in place.   EKG 78 bpm, normal axis, left bundle branch block, qtc 511 atrial sensing and ventricular pacing with no significant ST segment or T wave changes.   Surgery was consulted and recommended medical therapy, along with GI consultation.    Placed on IV furosemide for diuresis.   Upper GI series negative for acute pathology or intussusception.   Patient with poor prognosis, advance heart failure and valvular heart disease.  Code status changed to DNR, patient and her family are interested in hospice care.  03/22 volume status has improved. Patient will follow as outpatient with hospice services.    Assessment and Plan: * Acute on chronic systolic (congestive) heart failure (HCC) Echocardiogram with preserved LV systolic function with EF 60 to [id number], RV systolic function is preserved, LA with severe dilatation, severe mitral valve regurgitation, with possible mitral valve stenosis, moderate to severe TR, mild aortic valve stenosis.   Patient was placed on furosemide for diuresis, negative fluid balance was achieved, with improvement in her symptoms.   Plan to continue medical care with  furosemide, spironolactone, and metoprolol.  Blood pressure support with midodrine.  Patient with advanced heart failure and poor prognosis, not candidate  for valve repair.   Plan to follow up as outpatient with hospice services.   Continue supplemental 02 per [place].   Hiatal hernia Positive dyspepsia,  Patient had radiographic abdominal series with oral barium, resulted with small hiatal hernia. Antral and duodenal abnormality suggested by CT were negative.   Continue with antiacid therapy. Small portions and seating up right for meals.  Pain control with morphine.   Anemia, iron deficiency Continue close follow up on Hgb and hct.   Hyperthyroidism Continue with methimazole.   Hyperlipidemia Continue with atorvastatin.   Protein-calorie malnutrition, severe Continue with nutritional supplements.   Stage 3b chronic kidney disease (CKD) (HCC) Her volume status has improved, renal function is stable with serum cr at 1,36 with K at 4,5 and serum bicarbonate at 23.   Plan to continue furosemide and spironolactone.  Follow up renal function as outpatient.   Atrial fibrillation, chronic ([place]) Patient on paced rhythm. Continue rate control with metoprolol.  Anticoagulation with apixaban.          Consultants: cardiology  Procedures performed: none   Disposition: Home Diet recommendation:  Cardiac diet DISCHARGE MEDICATION: Allergies as of 05/02/2022   No Known Allergies      Medication List     STOP taking these medications    empagliflozin 10 MG Tabs tablet Commonly known as: JARDIANCE   lisinopril 2.5 MG tablet Commonly known as: ZESTRIL       TAKE these medications    atorvastatin 20 MG tablet Commonly known as: LIPITOR Take 20 mg by mouth every evening.   calcium carbonate 1500 (600 Ca) MG Tabs tablet Commonly known as: OSCAL Take 600 mg of elemental calcium by mouth at bedtime.   CYANOCOBALAMIN PO Take 1 tablet by mouth daily.   docusate sodium 50 MG capsule Commonly known as: COLACE Take 50 mg by mouth every 3 (three) days.   Eliquis 2.5 MG Tabs tablet Generic drug: apixaban Take 2.5 mg  by mouth 2 (two) times daily.   ferrous sulfate 325 (65 FE) MG tablet Take 325 mg by mouth daily with breakfast.   furosemide 40 MG tablet Commonly known as: LASIX Take 1 tablet (40 mg total) by mouth daily. What changed:  medication strength how much to take additional instructions   methimazole 5 MG tablet Commonly known as: TAPAZOLE Take 2.5 mg by mouth daily.   metoprolol tartrate 25 MG tablet Commonly known as: LOPRESSOR Take 25 mg by mouth 2 (two) times daily.   midodrine 5 MG tablet Commonly known as: PROAMATINE Take 1 tablet (5 mg total) by mouth 2 (two) times daily with a meal.   nitroGLYCERIN 0.4 MG SL tablet Commonly known as: NITROSTAT Place 0.4 mg under the tongue every 5 (five) minutes as needed for chest pain.   oxymetazoline 0.05 % nasal spray Commonly known as: AFRIN Place 1 spray into both nostrils 2 (two) times daily as needed for congestion.   pantoprazole 40 MG tablet Commonly known as: PROTONIX Take 40 mg by mouth 2 (two) times daily before a meal.   PRESERVISION AREDS 2 PO Take 1 tablet by mouth 2 (two) times daily.   spironolactone 25 MG tablet Commonly known as: ALDACTONE Take 12.5 mg by mouth daily.               Durable Medical Equipment  (From admission, onward)  Start     Ordered   05/01/22 1333  For home use only DME Walker rolling  Once       Question Answer Comment  Walker: With 5 Inch Wheels   Patient needs a walker to treat with the following condition Weakness      05/01/22 1332            Follow-up Information     [facility] Follow up.   Specialty: [place] Why: Agency will call you to set up apt times Contact information: [place] [address] [phone]         Loel Dubonnet, NP Follow up.   Specialty: Cardiology Why: Cardiology follow-up arranged at [facility] at [facility] on Tuesday May 12, 2022 at 10:05 AM  (Arrive by 9:50 AM). Urban Gibson is one of our nurse practitioners that works with Dr. Harrell Gave with [facility]. Contact information: [place] [address] [phone]                Discharge Exam: Filed Weights   04/29/22 0455 04/30/22 0253 05/01/22 0539  Weight: 40.3 kg 40 kg 40.1 kg   BP 107/66 (BP Location: Left Arm)   Pulse 86   Temp (!) 97.2 F (36.2 C) (Axillary)   Resp 20   Ht 5\' 1"  (1.549 m)   Wt 40.1 kg   SpO2 96%   BMI 16.70 kg/m   Patient is feeling better, dyspnea has improved, and able to tolerate po well.   Neurology awake and alert ENT with mild pallor Cardiovascular with S1 and S2 present and regular, no gallops, positive systolic murmur at the apex with radiation to the axilla. No JVD No lower extremity edema Respiratory with no rales or wheezing Abdomen with no distention   Condition at discharge: stable  The results of significant diagnostics from this hospitalization (including imaging, microbiology, ancillary and laboratory) are listed below for reference.   Imaging Studies: DG UGI W SINGLE CM (SOL OR THIN BA)  Result Date: 04/29/2022 CLINICAL DATA:  Question gastric and duodenal abnormalities based on CT reading. EXAM: DG UGI W SINGLE CM TECHNIQUE: Single contrast examination was then performed using thin liquid barium. This exam was performed by Brynda Greathouse PA-C, and was supervised and interpreted by Dr. Maree Erie. FLUOROSCOPY: Radiation Exposure Index (as provided by the fluoroscopic device): 1 minute 42 seconds 358.04 micro gray meter squared COMPARISON:  CT 04/26/2022 FINDINGS: Esophagus:  Normal appearance. Esophageal motility: Tertiary contractions observed leading to mild dysmotility with incomplete primary stripping wave with retrograde flow of barium bolus. Gastroesophageal reflux:  None visualized. Ingested 97mm barium tablet: Not given Stomach: Small hiatal hernia. No evidence of antral deformity. No  ulceration or diverticulum. Gastric emptying: Normal. Duodenum: Normal appearance. No evidence of duodenal bulb pathology. No sign of intussusception. Other: Limited exam due to patient frailty, immobility, and clinical status. IMPRESSION: 1.  Small hiatal hernia 2.  Tertiary contractions leading to mild esophageal dysmotility. Otherwise normal examination. Antral and duodenal abnormality suggested by CT are not present. Electronically Signed   By: Nelson Chimes M.D.   On: 04/29/2022 11:24   ECHOCARDIOGRAM COMPLETE  Result Date: 04/29/2022    ECHOCARDIOGRAM REPORT   Patient Name:   Jane [name] Date of Exam: 04/29/2022 Medical Rec #:  [id number]    Height:       61.0 in Accession #:    [id number]   Weight:  88.8 lb Date of Birth:  1933/08/02    BSA:          1.338 m Patient Age:    51 years     BP:           118/72 mmHg Patient Gender: F            HR:           75 bpm. Exam Location:  Inpatient Procedure: 2D Echo, Cardiac Doppler and Color Doppler Indications:    I50.31 CHF  History:        Patient has no prior history of Echocardiogram examinations and                 Patient has prior history of Echocardiogram examinations, most                 recent 11/06/2020. Cardiomyopathy, Mitral Valve Disease,                 Signs/Symptoms:Shortness of Breath; Risk Factors:Non-Smoker,                 Hypertension and Dyslipidemia.  Sonographer:    Wilkie Aye RVT RCS Referring Phys: [place]  1. Left ventricular ejection fraction, by estimation, is 60 to 65%. The left ventricle has normal function. The left ventricle has no regional wall motion abnormalities. Left ventricular diastolic parameters are consistent with Grade II diastolic dysfunction (pseudonormalization).  2. Right ventricular systolic function is normal. The right ventricular size is normal.  3. Left atrial size was severely dilated.  4. Right atrial size was mildly dilated.  5. The mitral valve is degenerative. Severe mitral  valve regurgitation with posterior leaflet restriction. Moderate mitral annular and valvular calcification. I suspect that there is at least mild mitral stenosis but I cannot measure mean valve gradient  from this study.  6. The tricuspid valve is abnormal. Tricuspid valve regurgitation is moderate to severe.  7. The aortic valve is tricuspid. There is moderate calcification of the aortic valve. Aortic valve regurgitation is trivial. Mild aortic valve stenosis. Aortic valve mean gradient measures 10.0 mmHg.  8. The inferior vena cava is dilated in size with <50% respiratory variability, suggesting right atrial pressure of 15 mmHg. FINDINGS  Left Ventricle: Left ventricular ejection fraction, by estimation, is 60 to 65%. The left ventricle has normal function. The left ventricle has no regional wall motion abnormalities. The left ventricular internal cavity size was normal in size. There is  no left ventricular hypertrophy. Left ventricular diastolic parameters are consistent with Grade II diastolic dysfunction (pseudonormalization). Right Ventricle: The right ventricular size is normal. No increase in right ventricular wall thickness. Right ventricular systolic function is normal. Left Atrium: Left atrial size was severely dilated. Right Atrium: Right atrial size was mildly dilated. Pericardium: There is no evidence of pericardial effusion. Mitral Valve: The mitral valve is degenerative in appearance. There is moderate calcification of the mitral valve leaflet(s). Moderate mitral annular calcification. Severe mitral valve regurgitation. Tricuspid Valve: The tricuspid valve is abnormal. Tricuspid valve regurgitation is moderate to severe. Aortic Valve: The aortic valve is tricuspid. There is moderate calcification of the aortic valve. Aortic valve regurgitation is trivial. Aortic regurgitation PHT measures 498 msec. Mild aortic stenosis is present. Aortic valve mean gradient measures 10.0  mmHg. Aortic valve peak  gradient measures 18.0 mmHg. Pulmonic Valve: The pulmonic valve was normal in structure. Pulmonic valve regurgitation is trivial. Aorta: The aortic root is normal in size  and structure. Venous: The inferior vena cava is dilated in size with less than 50% respiratory variability, suggesting right atrial pressure of 15 mmHg. IAS/Shunts: No atrial level shunt detected by color flow Doppler. Additional Comments: A device lead is visualized in the right ventricle.  LEFT VENTRICLE PLAX 2D LVIDd:         4.50 cm LVIDs:         2.40 cm LV PW:         0.90 cm LV IVS:        0.80 cm LVOT diam:     1.70 cm LVOT Area:     2.27 cm  RIGHT VENTRICLE             IVC RV Basal diam:  3.90 cm     IVC diam: 2.10 cm RV S prime:     10.90 cm/s TAPSE (M-mode): 1.6 cm LEFT ATRIUM              Index         RIGHT ATRIUM           Index LA diam:        5.00 cm  3.74 cm/m    RA Area:     16.10 cm LA Vol (A2C):   180.0 ml 134.57 ml/m  RA Volume:   41.30 ml  30.88 ml/m LA Vol (A4C):   146.0 ml 109.15 ml/m LA Biplane Vol: 175.0 ml 130.84 ml/m  AORTIC VALVE                    PULMONIC VALVE AV Vmax:           212.00 cm/s  PV Vmax:          0.76 m/s AV Vmean:          142.000 cm/s PV Peak grad:     2.3 mmHg AV VTI:            0.408 m      PR End Diast Vel: 2.47 msec AV Peak Grad:      18.0 mmHg AV Mean Grad:      10.0 mmHg AI PHT:            498 msec AR Vena Contracta: 0.20 cm  AORTA Ao Root diam: 2.40 cm Ao Asc diam:  3.60 cm MITRAL VALVE                  TRICUSPID VALVE MV Area (PHT): 4.49 cm       TR Peak grad:   80.6 mmHg MV Decel Time: 169 msec       TR Vmax:        449.00 cm/s MR Peak grad:    144.5 mmHg MR Mean grad:    79.0 mmHg    SHUNTS MR Vmax:         601.00 cm/s  Systemic Diam: 1.70 cm MR Vmean:        406.0 cm/s MR PISA:         2.55 cm MR PISA Eff ROA: 21 mm MR PISA Radius:  0.80 cm MV E velocity: 172.00 cm/s MV A velocity: 113.00 cm/s MV E/A ratio:  1.52 Dalton McleanMD Electronically signed by Franki Monte Signature  Date/Time: 04/29/2022/10:27:07 AM    Final    DG Abd 2 Views  Result Date: 04/28/2022 CLINICAL DATA:  89 year old female with evidence of gastric ulcer on CT Abdomen and Pelvis. EXAM: ABDOMEN - 2 VIEW COMPARISON:  CT Abdomen and Pelvis 04/26/2022. FINDINGS: Upright and supine views of the abdomen at 0619 hours. Cardiomegaly, cardiac AICD, lung base effusions and patchy opacity redemonstrated. Extensive Aortoiliac calcified atherosclerosis. Paucity of bowel gas in the upper abdomen. Normal gas containing cecum in the right lower quadrant. No pneumoperitoneum identified. Stable cholecystectomy clips. Excreted IV contrast in the bladder. Stable visualized osseous structures. IMPRESSION: 1. No pneumoperitoneum identified. Paucity of bowel gas in the upper abdomen, nonobstructed pattern elsewhere. 2. Excreted IV contrast in the bladder. 3. Ongoing lung base pleural effusions, patchy opacity. 4.  Aortic Atherosclerosis (ICD10-I70.0). Electronically Signed   By: Genevie Ann M.D.   On: 04/28/2022 08:25   DG Chest Portable 1 View  Result Date: 04/26/2022 CLINICAL DATA:  Right lower quadrant pain that radiates into the right leg. EXAM: PORTABLE CHEST 1 VIEW COMPARISON:  None Available. FINDINGS: Radiopaque sternal fixation plates and screws and vascular clips are present. A multi lead AICD is also seen. There is mild to moderate severity enlargement of the cardiac silhouette with marked severity calcification of the thoracic aorta. Mild to moderate severity diffusely increased interstitial lung markings are seen. Mild atelectasis and/or infiltrate is also present within the mid to lower right lung and left lung base. There is no evidence of a pleural effusion or pneumothorax. Multilevel degenerative changes seen throughout the thoracic spine. IMPRESSION: 1. Cardiomegaly and evidence of prior median sternotomy/CABG with mild to moderate severity interstitial edema. 2. Mild bilateral atelectasis and/or infiltrate.  Electronically Signed   By: Virgina [name] M.D.   On: 04/26/2022 23:31   CT ABDOMEN PELVIS W CONTRAST  Result Date: 04/26/2022 CLINICAL DATA:  Right lower quadrant pain. EXAM: CT ABDOMEN AND PELVIS WITH CONTRAST TECHNIQUE: Multidetector CT imaging of the abdomen and pelvis was performed using the standard protocol following bolus administration of intravenous contrast. RADIATION DOSE REDUCTION: This exam was performed according to the departmental dose-optimization program which includes automated exposure control, adjustment of the mA and/or kV according to patient size and/or use of iterative reconstruction technique. CONTRAST:  42mL OMNIPAQUE IOHEXOL 300 MG/ML  SOLN COMPARISON:  None Available. FINDINGS: Lower chest: Moderate to marked severity areas of scarring and/or atelectasis are seen within the bilateral lung bases. There is a moderate-sized right-sided pleural effusion. A very small left pleural effusion is also noted. Hepatobiliary: No focal liver abnormality is seen. Status post cholecystectomy. No biliary dilatation. Pancreas: Unremarkable. No pancreatic ductal dilatation or surrounding inflammatory changes. Spleen: Normal in size without focal abnormality. Adrenals/Urinary Tract: The right adrenal gland is unremarkable. There is mild diffuse left adrenal gland enlargement. Kidneys are normal in size, without renal calculi or hydronephrosis. A 6.2 cm x 6.2 cm x 9.7 cm lobulated, multi septated, exophytic cyst is seen extending from the lower pole of the left kidney. Numerous subcentimeter bilateral simple renal cysts are also seen. The urinary bladder is poorly distended and is otherwise unremarkable. Stomach/Bowel: A markedly thickened hypodense gastric antrum is seen (approximately 59.86 Hounsfield units). A 1.7 cm x 1.6 cm x 1.4 cm fluid-filled area of outpouching is noted along the dorsal aspect of the gastric antrum (axial CT image 31, CT series 2/coronal reformatted image 56, CT series 5).  Diffusely dense gastric mucosa is also seen. The appendix is not clearly identified. A short segment of intussuscepted proximal duodenum is noted just beyond the duodenal bulb (best seen on coronal reformatted images 50 through 55, CT series 5). No evidence of bowel dilatation. Noninflamed diverticula are seen throughout the sigmoid colon. Vascular/Lymphatic: Aortic  atherosclerosis. No enlarged abdominal or pelvic lymph nodes. Reproductive: Multiple heterogeneous uterine fibroids are seen. The largest measures approximately 2.7 cm by 2.0 cm x 1.6 cm and is seen within the lateral aspect of the uterus on the left. The bilateral adnexa are unremarkable. Other: No abdominal wall hernia or abnormality. No abdominopelvic ascites. Musculoskeletal: Multilevel degenerative changes are seen throughout the lumbar spine. IMPRESSION: 1. 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 Sequelae associated with a large gastric ulcer and/or an underlying neoplastic process cannot be excluded. Correlation with upper endoscopy is recommended. 2. Short segment of intussusception involving the proximal duodenum. This may be transient in nature. 3. Sigmoid diverticulosis. 4. Multiple heterogeneous uterine fibroids. Correlation with nonemergent pelvic ultrasound is recommended. 5. Moderate to marked severity bibasilar scarring and/or atelectasis. 6. Bilateral pleural effusions, right greater than left. 7. Evidence of prior cholecystectomy. 8. Large, lobulated, multi septated, exophytic left renal cyst. Further evaluation with nonemergent renal ultrasound is recommended. 9. Aortic atherosclerosis. Aortic Atherosclerosis (ICD10-I70.0). Electronically Signed   By: Virgina [name] M.D.   On: 04/26/2022 23:27    Microbiology: Results for orders placed or performed during the hospital encounter of 04/26/22  Resp panel by RT-PCR (RSV, Flu A&B, Covid) Anterior Nasal Swab     Status: None   Collection Time:  04/26/22 10:42 PM   Specimen: Anterior Nasal Swab  Result Value Ref Range Status   SARS Coronavirus 2 by RT PCR NEGATIVE NEGATIVE Final    Comment: (NOTE) SARS-CoV-2 target nucleic acids are NOT DETECTED.  The SARS-CoV-2 RNA is generally detectable in upper respiratory specimens during the acute phase of infection. The lowest concentration of SARS-CoV-2 viral copies this assay can detect is 138 copies/mL. A negative result does not preclude SARS-Cov-2 infection and should not be used as the sole basis for treatment or other patient management decisions. A negative result may occur with  improper specimen collection/handling, submission of specimen other than nasopharyngeal swab, presence of viral mutation(s) within the areas targeted by this assay, and inadequate number of viral copies(<138 copies/mL). A negative result must be combined with clinical observations, patient history, and epidemiological information. The expected result is Negative.  Fact Sheet for Patients:  EntrepreneurPulse.com.au  Fact Sheet for Healthcare Providers:  IncredibleEmployment.be  This test is no t yet approved or cleared by the [place] FDA and  has been authorized for detection and/or diagnosis of SARS-CoV-2 by FDA under an Emergency Use Authorization (EUA). This EUA will remain  in effect (meaning this test can be used) for the duration of the COVID-19 declaration under Section 564(b)(1) of the Act, 21 U.S.C.section 360bbb-3(b)(1), unless the authorization is terminated  or revoked sooner.       Influenza A by PCR NEGATIVE NEGATIVE Final   Influenza B by PCR NEGATIVE NEGATIVE Final    Comment: (NOTE) The Xpert Xpress SARS-CoV-2/FLU/RSV plus assay is intended as an aid in the diagnosis of influenza from Nasopharyngeal swab specimens and should not be used as a sole basis for treatment. Nasal washings and aspirates are unacceptable for Xpert Xpress  SARS-CoV-2/FLU/RSV testing.  Fact Sheet for Patients: EntrepreneurPulse.com.au  Fact Sheet for Healthcare Providers: IncredibleEmployment.be  This test is not yet approved or cleared by the [place] FDA and has been authorized for detection and/or diagnosis of SARS-CoV-2 by FDA under an Emergency Use Authorization (EUA). This EUA will remain in effect (meaning this test can be used) for the duration of the COVID-19 declaration under Section 564(b)(1) of the  Act, 21 U.S.C. section 360bbb-3(b)(1), unless the authorization is terminated or revoked.     Resp Syncytial Virus by PCR NEGATIVE NEGATIVE Final    Comment: (NOTE) Fact Sheet for Patients: EntrepreneurPulse.com.au  Fact Sheet for Healthcare Providers: IncredibleEmployment.be  This test is not yet approved or cleared by the [place] FDA and has been authorized for detection and/or diagnosis of SARS-CoV-2 by FDA under an Emergency Use Authorization (EUA). This EUA will remain in effect (meaning this test can be used) for the duration of the COVID-19 declaration under Section 564(b)(1) of the Act, 21 U.S.C. section 360bbb-3(b)(1), unless the authorization is terminated or revoked.  Performed at [facility], [address] [place], [place] [address]   Urine Culture (for pregnant, neutropenic or urologic patients or patients with an indwelling urinary catheter)     Status: Abnormal   Collection Time: 04/27/22  5:15 AM   Specimen: Urine, Clean Catch  Result Value Ref Range Status   Specimen Description   Final    URINE, CLEAN CATCH Performed at [facility], [address] [place], [place] [address]    Special Requests   Final    NONE Performed at [facility], [address] [place], [place] [address]    Culture 30,000 COLONIES/mL ENTEROCOCCUS FAECALIS (A)  Final   Report Status 04/29/2022 FINAL  Final    Organism ID, Bacteria ENTEROCOCCUS FAECALIS (A)  Final      Susceptibility   Enterococcus faecalis - MIC*    AMPICILLIN <=2 SENSITIVE Sensitive     NITROFURANTOIN <=16 SENSITIVE Sensitive     VANCOMYCIN 2 SENSITIVE Sensitive     * 30,000 COLONIES/mL ENTEROCOCCUS FAECALIS    Labs: CBC: Recent Labs  Lab 04/26/22 2122 04/28/22 0021 05/01/22 0039  WBC 10.0 9.6 9.4  HGB 13.7 11.8* 12.2  HCT 44.4 38.7 40.5  MCV 104.7* 106.9* 108.3*  PLT 185 190 [id number]   Basic Metabolic Panel: Recent Labs  Lab 04/26/22 2122 04/28/22 0021 04/30/22 0022 05/01/22 0039  NA 141 144 140 140  K 3.2* 3.7 3.3* 4.5  CL 108 108 108 109  CO2 23 26 25 23   GLUCOSE 118* 86 95 112*  BUN 36* 31* 35* 36*  CREATININE 1.47* 1.34* 1.35* 1.36*  CALCIUM 9.1 8.8* 8.2* 8.7*   Liver Function Tests: Recent Labs  Lab 04/26/22 2122  AST 16  ALT 15  ALKPHOS 65  BILITOT 1.1  PROT 7.1  ALBUMIN 3.4*   CBG: No results for input(s): "GLUCAP" in the last 168 hours.  Discharge time spent: greater than 30 minutes.  Signed: Tawni Millers, MD [facility] 05/01/2022

## 2022-05-02 ENCOUNTER — Encounter (HOSPITAL_COMMUNITY): Payer: Self-pay | Admitting: Internal Medicine

## 2022-05-02 DIAGNOSIS — K449 Diaphragmatic hernia without obstruction or gangrene: Secondary | ICD-10-CM | POA: Diagnosis not present

## 2022-05-02 DIAGNOSIS — I5023 Acute on chronic systolic (congestive) heart failure: Secondary | ICD-10-CM | POA: Diagnosis not present

## 2022-05-02 DIAGNOSIS — D509 Iron deficiency anemia, unspecified: Secondary | ICD-10-CM | POA: Diagnosis not present

## 2022-05-02 DIAGNOSIS — E43 Unspecified severe protein-calorie malnutrition: Secondary | ICD-10-CM | POA: Diagnosis not present

## 2022-05-02 LAB — GLUCOSE, CAPILLARY: Glucose-Capillary: 103 mg/dL — ABNORMAL HIGH (ref 70–99)

## 2022-05-02 MED ORDER — MIDODRINE HCL 5 MG PO TABS: 5.0000 mg | ORAL_TABLET | Freq: Two times a day (BID) | ORAL | 0 refills | Status: AC

## 2022-05-02 MED ORDER — MIDODRINE HCL 5 MG PO TABS: 5.0000 mg | ORAL_TABLET | Freq: Two times a day (BID) | ORAL | Status: DC

## 2022-05-02 MED ORDER — FUROSEMIDE 40 MG PO TABS: 40.0000 mg | ORAL_TABLET | Freq: Every day | ORAL | 0 refills | Status: DC

## 2022-05-02 NOTE — Progress Notes (Signed)
Patient has been feeling well, last night she woke up at 4 am, trying to have a walk in the hallway, she went back to sleep. This am around 7 am she was noted to be confused, not able to talk and having dyspnea. Symptoms self resolved in about 1 to 2 hrs range. By the time of my examination she is feeling better, dyspnea at baseline.   BP 114/67 (BP Location: Left Arm)   Pulse 79   Temp (!) 97.4 F (36.3 C) (Oral)   Resp 20   Ht 5\' 1"  (1.549 m)   Wt 39.8 kg   SpO2 100%   BMI 16.58 kg/m   Patient is feeling well, no chest pain or dyspnea, continue very weak and deconditioned, her daughters are at the bedside.   Neurology awake and alert ENT with mild pallor Cardiovascular with S1 and S2 present and regular, positive loud systolic murmur at the apex, no gallops, or rubs.  Respiratory with poor inspiratory effort, with no wheezing or rales, no rhonchi Abdomen with no distention   Advance heart failure, end stage.   Plan: Discharge patient home today if no further episodes of worsening dyspnea.  Follow up with hospice services at home.   I spoke with patient's daughters at the bedside, we talked in detail about patient's condition, plan of care and prognosis and all questions were addressed.

## 2022-05-02 NOTE — Plan of Care (Signed)

## 2022-05-02 NOTE — TOC Transition Note (Addendum)
Transition of Care [facility]) - CM/SW Discharge Note   Patient Details  Name: Jane [name] MRN: [id number] Date of Birth: 1934/02/03  [facility] CM/SW Contact:  Carles Collet, RN Phone Number: 05/02/2022, 9:23 AM   Clinical Narrative:     Damaris Schooner w patient's daughter, Claiborne Billings, she states they would like to proceed with HoP for H Hospice services with her mother returning to her home at address on facesheet.  RW and 3/1 will be needed for DC, with hospital bed later when available (OK for Monday per daughter), and oxygen switched out by hospice to their provider. [facility] transport will be set up at time of DC by TOC.  Discussed with Tharon Aquas RN, HoP.   Final next level of care: Home w Hospice Care Barriers to Discharge: No Barriers Identified   Patient Goals and CMS Choice CMS Medicare.gov Compare Post Acute Care list provided to:: Other (Comment Required) Choice offered to / list presented to : Adult Children  Discharge Placement                         Discharge Plan and Services Additional resources added to the After Visit Summary for                              [place] [facility] [place] Date [place]: 05/02/22 Time [place]: [phone] Representative spoke with at [place]: [place] Determinants of Health ([place]) Interventions SDOH Screenings   Food Insecurity: No Food Insecurity (05/02/2022)  Housing: Low Risk  (05/02/2022)  Transportation Needs: No Transportation Needs (05/02/2022)  Utilities: Not At Risk (05/02/2022)  Tobacco Use: Low Risk  (05/02/2022)     Readmission Risk Interventions     No data to display

## 2022-05-02 NOTE — Progress Notes (Signed)
TRH night cross cover note:   I was notified by RN that peripheral access on this patient, who is anticipated to be discharged on 05/02/2022, has been lost.  She is not on any scheduled IV medications at this time.  Consequently, I conveyed that it is okay to not reestablish peripheral IV access at this time.     Babs Bertin, DO Hospitalist

## 2022-05-02 NOTE — Progress Notes (Signed)
Discharge teaching complete. Meds, diet, activity, follow up appointments reviewed and all questions answered. Copy of instructions given to patient and prescriptions sent to pharmacy. All belongings sent with family.

## 2022-05-12 ENCOUNTER — Ambulatory Visit (HOSPITAL_BASED_OUTPATIENT_CLINIC_OR_DEPARTMENT_OTHER): Payer: Medicare Other | Admitting: Family

## 2022-05-12 NOTE — Progress Notes (Deleted)
Office Visit    Patient Name: Jane [name] Date of Encounter: 05/12/2022  PCP:  System, Provider Not In   [place]  Cardiologist:  Buford Dresser, MD  Advanced Practice Provider:  No care team member to display Electrophysiologist:  None      Chief Complaint    Jane [name] is a 89 y.o. female presents today for hospital follow up   Past Medical History    Past Medical History:  Diagnosis Date   CHF (congestive heart failure) ([place])    COPD (chronic obstructive pulmonary disease) ([place])    Hyperlipidemia    Hypertension    Mitral regurgitation    Pneumonia    Thyroid disease    Past Surgical History:  Procedure Laterality Date   CHOLECYSTECTOMY     CORONARY ARTERY BYPASS GRAFT     INSERTION OF ICD      Allergies  No Known Allergies  History of Present Illness    Deavian Sawada is a 89 y.o. female with a hx of CAD s/p CABG and multiple PCI's, HLD, CKD 3B, severe MR not amenable to TEE ER due to mitral stenosis, paroxysmal atrial fibrillation, ischemic cardiomyopathy with recovered LVEF s/p CRT-D, hypothyroidism, COPD, hiatal hernia, chronic abdominal pain with belching last seen while hospitalized.  Admitted 3/17 - 04/30/2020 after presenting with edema and dyspnea.  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  Upper GI series negative for acute pathology or an intussusception.  No surgical intervention required.  Echocardiogram with preserved LVEF 60 to 65%, preserved RV function, LA severely dilated, severe MR with possible MV stenosis, moderate to severe TR, mild aortic valve stenosis.  She was diuresed and discharged with furosemide, spironolactone, metoprolol.  Required blood pressure support with midodrine.  Discharged with hospice.  EKGs/Labs/Other Studies Reviewed:   The following studies were reviewed today: Cardiac Studies &  Procedures       ECHOCARDIOGRAM  ECHOCARDIOGRAM COMPLETE 04/29/2022  Narrative ECHOCARDIOGRAM REPORT    Patient Name:   Jane [name] Date of Exam: 04/29/2022 Medical Rec #:  [id number]    Height:       61.0 in Accession #:    [id number]   Weight:       88.8 lb Date of Birth:  1933/06/26    BSA:          1.338 m Patient Age:    89 years     BP:           118/72 mmHg Patient Gender: F            HR:           75 bpm. Exam Location:  Inpatient  Procedure: 2D Echo, Cardiac Doppler and Color Doppler  Indications:    I50.31 CHF  History:        Patient has no prior history of Echocardiogram examinations and Patient has prior history of Echocardiogram examinations, most recent 11/06/2020. Cardiomyopathy, Mitral Valve Disease, Signs/Symptoms:Shortness of Breath; Risk Factors:Non-Smoker, Hypertension and Dyslipidemia.  Sonographer:    Wilkie Aye RVT RCS Referring Phys: [place]   1. Left ventricular ejection fraction, by estimation, is 60 to 65%. The left ventricle has normal function. The left ventricle has no regional wall motion abnormalities. Left ventricular diastolic parameters are consistent with Grade II diastolic dysfunction (pseudonormalization). 2. Right ventricular systolic function is normal. The right ventricular size is normal. 3. Left  atrial size was severely dilated. 4. Right atrial size was mildly dilated. 5. The mitral valve is degenerative. Severe mitral valve regurgitation with posterior leaflet restriction. Moderate mitral annular and valvular calcification. I suspect that there is at least mild mitral stenosis but I cannot measure mean valve gradient from this study. 6. The tricuspid valve is abnormal. Tricuspid valve regurgitation is moderate to severe. 7. The aortic valve is tricuspid. There is moderate calcification of the aortic valve. Aortic valve regurgitation is trivial. Mild aortic valve stenosis. Aortic valve mean gradient  measures 10.0 mmHg. 8. The inferior vena cava is dilated in size with <50% respiratory variability, suggesting right atrial pressure of 15 mmHg.  FINDINGS Left Ventricle: Left ventricular ejection fraction, by estimation, is 60 to 65%. The left ventricle has normal function. The left ventricle has no regional wall motion abnormalities. The left ventricular internal cavity size was normal in size. There is no left ventricular hypertrophy. Left ventricular diastolic parameters are consistent with Grade II diastolic dysfunction (pseudonormalization).  Right Ventricle: The right ventricular size is normal. No increase in right ventricular wall thickness. Right ventricular systolic function is normal.  Left Atrium: Left atrial size was severely dilated.  Right Atrium: Right atrial size was mildly dilated.  Pericardium: There is no evidence of pericardial effusion.  Mitral Valve: The mitral valve is degenerative in appearance. There is moderate calcification of the mitral valve leaflet(s). Moderate mitral annular calcification. Severe mitral valve regurgitation.  Tricuspid Valve: The tricuspid valve is abnormal. Tricuspid valve regurgitation is moderate to severe.  Aortic Valve: The aortic valve is tricuspid. There is moderate calcification of the aortic valve. Aortic valve regurgitation is trivial. Aortic regurgitation PHT measures 498 msec. Mild aortic stenosis is present. Aortic valve mean gradient measures 10.0 mmHg. Aortic valve peak gradient measures 18.0 mmHg.  Pulmonic Valve: The pulmonic valve was normal in structure. Pulmonic valve regurgitation is trivial.  Aorta: The aortic root is normal in size and structure.  Venous: The inferior vena cava is dilated in size with less than 50% respiratory variability, suggesting right atrial pressure of 15 mmHg.  IAS/Shunts: No atrial level shunt detected by color flow Doppler.  Additional Comments: A device lead is visualized in the right  ventricle.   LEFT VENTRICLE PLAX 2D LVIDd:         4.50 cm LVIDs:         2.40 cm LV PW:         0.90 cm LV IVS:        0.80 cm LVOT diam:     1.70 cm LVOT Area:     2.27 cm   RIGHT VENTRICLE             IVC RV Basal diam:  3.90 cm     IVC diam: 2.10 cm RV S prime:     10.90 cm/s TAPSE (M-mode): 1.6 cm  LEFT ATRIUM              Index         RIGHT ATRIUM           Index LA diam:        5.00 cm  3.74 cm/m    RA Area:     16.10 cm LA Vol (A2C):   180.0 ml 134.57 ml/m  RA Volume:   41.30 ml  30.88 ml/m LA Vol (A4C):   146.0 ml 109.15 ml/m LA Biplane Vol: 175.0 ml 130.84 ml/m AORTIC VALVE  PULMONIC VALVE AV Vmax:           212.00 cm/s  PV Vmax:          0.76 m/s AV Vmean:          142.000 cm/s PV Peak grad:     2.3 mmHg AV VTI:            0.408 m      PR End Diast Vel: 2.47 msec AV Peak Grad:      18.0 mmHg AV Mean Grad:      10.0 mmHg AI PHT:            498 msec AR Vena Contracta: 0.20 cm  AORTA Ao Root diam: 2.40 cm Ao Asc diam:  3.60 cm  MITRAL VALVE                  TRICUSPID VALVE MV Area (PHT): 4.49 cm       TR Peak grad:   80.6 mmHg MV Decel Time: 169 msec       TR Vmax:        449.00 cm/s MR Peak grad:    144.5 mmHg MR Mean grad:    79.0 mmHg    SHUNTS MR Vmax:         601.00 cm/s  Systemic Diam: 1.70 cm MR Vmean:        406.0 cm/s MR PISA:         2.55 cm MR PISA Eff ROA: 21 mm MR PISA Radius:  0.80 cm MV E velocity: 172.00 cm/s MV A velocity: 113.00 cm/s MV E/A ratio:  1.52  Dalton McleanMD Electronically signed by Franki Monte Signature Date/Time: 04/29/2022/10:27:07 AM    Final              EKG:  EKG is  ordered today.  The ekg ordered today demonstrates ***  Recent Labs: 04/26/2022: ALT 15; B Natriuretic Peptide 1,839.9 05/01/2022: BUN 36; Creatinine, Ser 1.36; Hemoglobin 12.2; Platelets 220; Potassium 4.5; Sodium 140  Recent Lipid Panel No results found for: "CHOL", "TRIG", "HDL", "CHOLHDL", "VLDL", "LDLCALC",  "LDLDIRECT"  Risk Assessment/Calculations:  {Does this patient have ATRIAL FIBRILLATION?:[phone]}  Home Medications   No outpatient medications have been marked as taking for the 05/12/22 encounter (Appointment) with Loel Dubonnet, NP.     Review of Systems      All other systems reviewed and are otherwise negative except as noted above.  Physical Exam    VS:  There were no vitals taken for this visit. , BMI There is no height or weight on file to calculate BMI.  Wt Readings from Last 3 Encounters:  05/02/22 87 lb 11.9 oz (39.8 kg)  12/31/20 97 lb (44 kg)  02/13/15 107 lb (48.5 kg)     GEN: Well nourished, well developed, in no acute distress. HEENT: normal. Neck: Supple, no JVD, carotid bruits, or masses. Cardiac: ***RRR, no murmurs, rubs, or gallops. No clubbing, cyanosis, edema.  ***Radials/PT 2+ and equal bilaterally.  Respiratory:  ***Respirations regular and unlabored, clear to auscultation bilaterally. GI: Soft, nontender, nondistended. MS: No deformity or atrophy. Skin: Warm and dry, no rash. Neuro:  Strength and sensation are intact. Psych: Normal affect.  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  {Refresh Note OR Click here to enter BP  :1}***      Disposition: Follow up {follow up:[id number]} with Buford Dresser, MD or APP.  Signed, Loel Dubonnet, NP 05/12/2022, 8:41 AM [place]

## 2022-06-10 DEATH — deceased

## 2024-01-03 ENCOUNTER — Other Ambulatory Visit: Payer: Self-pay
